# Patient Record
Sex: Female | Born: 1955 | Hispanic: No | Marital: Married | Smoking: Former smoker
Health system: Southern US, Community
[De-identification: ages and names within clinical notes are randomized; demographics above are authoritative.]

## PROBLEM LIST (undated history)

## (undated) DIAGNOSIS — L659 Nonscarring hair loss, unspecified: Secondary | ICD-10-CM

## (undated) DIAGNOSIS — R0609 Other forms of dyspnea: Secondary | ICD-10-CM

## (undated) DIAGNOSIS — R0600 Dyspnea, unspecified: Secondary | ICD-10-CM

## (undated) DIAGNOSIS — Z79899 Other long term (current) drug therapy: Secondary | ICD-10-CM

## (undated) DIAGNOSIS — E559 Vitamin D deficiency, unspecified: Secondary | ICD-10-CM

## (undated) DIAGNOSIS — D649 Anemia, unspecified: Secondary | ICD-10-CM

## (undated) DIAGNOSIS — E039 Hypothyroidism, unspecified: Secondary | ICD-10-CM

## (undated) HISTORY — PX: OTHER SURGICAL HISTORY: SHX169

## (undated) HISTORY — DX: Other forms of dyspnea: R06.09

## (undated) HISTORY — PX: COLONOSCOPY W/ BIOPSIES AND POLYPECTOMY: SHX1376

## (undated) HISTORY — DX: Vitamin D deficiency, unspecified: E55.9

## (undated) HISTORY — DX: Other long term (current) drug therapy: Z79.899

## (undated) HISTORY — DX: Nonscarring hair loss, unspecified: L65.9

## (undated) HISTORY — DX: Dyspnea, unspecified: R06.00

---

## 1997-10-02 ENCOUNTER — Other Ambulatory Visit: Admission: RE | Admit: 1997-10-02 | Discharge: 1997-10-02 | Payer: Self-pay | Admitting: Obstetrics and Gynecology

## 1998-07-23 ENCOUNTER — Other Ambulatory Visit: Admission: RE | Admit: 1998-07-23 | Discharge: 1998-07-23 | Payer: Self-pay | Admitting: Obstetrics and Gynecology

## 1998-11-12 ENCOUNTER — Other Ambulatory Visit: Admission: RE | Admit: 1998-11-12 | Discharge: 1998-11-12 | Payer: Self-pay | Admitting: Obstetrics and Gynecology

## 2000-01-13 ENCOUNTER — Other Ambulatory Visit: Admission: RE | Admit: 2000-01-13 | Discharge: 2000-01-13 | Payer: Self-pay | Admitting: Obstetrics and Gynecology

## 2001-03-29 ENCOUNTER — Other Ambulatory Visit: Admission: RE | Admit: 2001-03-29 | Discharge: 2001-03-29 | Payer: Self-pay | Admitting: Obstetrics and Gynecology

## 2002-05-16 ENCOUNTER — Other Ambulatory Visit: Admission: RE | Admit: 2002-05-16 | Discharge: 2002-05-16 | Payer: Self-pay | Admitting: Obstetrics and Gynecology

## 2003-07-03 ENCOUNTER — Other Ambulatory Visit: Admission: RE | Admit: 2003-07-03 | Discharge: 2003-07-03 | Payer: Self-pay | Admitting: Obstetrics and Gynecology

## 2004-01-29 ENCOUNTER — Other Ambulatory Visit: Admission: RE | Admit: 2004-01-29 | Discharge: 2004-01-29 | Payer: Self-pay | Admitting: Obstetrics and Gynecology

## 2004-09-30 ENCOUNTER — Other Ambulatory Visit: Admission: RE | Admit: 2004-09-30 | Discharge: 2004-09-30 | Payer: Self-pay | Admitting: Obstetrics and Gynecology

## 2008-10-02 ENCOUNTER — Encounter (INDEPENDENT_AMBULATORY_CARE_PROVIDER_SITE_OTHER): Payer: Self-pay | Admitting: Obstetrics and Gynecology

## 2008-10-02 ENCOUNTER — Ambulatory Visit (HOSPITAL_COMMUNITY): Admission: RE | Admit: 2008-10-02 | Discharge: 2008-10-02 | Payer: Self-pay | Admitting: Obstetrics and Gynecology

## 2008-10-04 ENCOUNTER — Encounter (INDEPENDENT_AMBULATORY_CARE_PROVIDER_SITE_OTHER): Payer: Self-pay | Admitting: Obstetrics and Gynecology

## 2008-10-04 ENCOUNTER — Ambulatory Visit: Admission: RE | Admit: 2008-10-04 | Discharge: 2008-10-04 | Payer: Self-pay | Admitting: Obstetrics and Gynecology

## 2008-10-04 ENCOUNTER — Ambulatory Visit: Payer: Self-pay | Admitting: Vascular Surgery

## 2009-04-09 ENCOUNTER — Encounter: Admission: RE | Admit: 2009-04-09 | Discharge: 2009-04-09 | Payer: Self-pay | Admitting: Obstetrics and Gynecology

## 2010-04-19 LAB — CBC
HCT: 37.3 % (ref 36.0–46.0)
Hemoglobin: 12.6 g/dL (ref 12.0–15.0)
MCHC: 33.8 g/dL (ref 30.0–36.0)
MCV: 94.7 fL (ref 78.0–100.0)
Platelets: 274 10*3/uL (ref 150–400)
RBC: 3.93 MIL/uL (ref 3.87–5.11)
RDW: 12.9 % (ref 11.5–15.5)
WBC: 6.2 10*3/uL (ref 4.0–10.5)

## 2011-10-06 ENCOUNTER — Other Ambulatory Visit: Payer: Self-pay | Admitting: Gastroenterology

## 2012-04-28 ENCOUNTER — Encounter (HOSPITAL_COMMUNITY): Payer: Self-pay | Admitting: *Deleted

## 2012-04-28 ENCOUNTER — Other Ambulatory Visit: Payer: Self-pay | Admitting: Neurosurgery

## 2012-04-29 ENCOUNTER — Ambulatory Visit (HOSPITAL_COMMUNITY): Payer: BC Managed Care – PPO | Admitting: Certified Registered Nurse Anesthetist

## 2012-04-29 ENCOUNTER — Encounter (HOSPITAL_COMMUNITY): Payer: Self-pay | Admitting: *Deleted

## 2012-04-29 ENCOUNTER — Ambulatory Visit (HOSPITAL_COMMUNITY): Payer: BC Managed Care – PPO

## 2012-04-29 ENCOUNTER — Encounter (HOSPITAL_COMMUNITY): Payer: Self-pay | Admitting: Certified Registered Nurse Anesthetist

## 2012-04-29 ENCOUNTER — Ambulatory Visit (HOSPITAL_COMMUNITY)
Admission: RE | Admit: 2012-04-29 | Discharge: 2012-04-30 | Disposition: A | Discharge status: Home / Self Care | Payer: BC Managed Care – PPO | Source: Ambulatory Visit | Attending: Neurosurgery | Admitting: Neurosurgery

## 2012-04-29 ENCOUNTER — Encounter (HOSPITAL_COMMUNITY)
Admission: RE | Disposition: A | Discharge status: Home / Self Care | Payer: Self-pay | Source: Ambulatory Visit | Attending: Neurosurgery

## 2012-04-29 DIAGNOSIS — M4310 Spondylolisthesis, site unspecified: Secondary | ICD-10-CM | POA: Insufficient documentation

## 2012-04-29 DIAGNOSIS — M5000 Cervical disc disorder with myelopathy, unspecified cervical region: Secondary | ICD-10-CM | POA: Insufficient documentation

## 2012-04-29 DIAGNOSIS — M4712 Other spondylosis with myelopathy, cervical region: Secondary | ICD-10-CM | POA: Insufficient documentation

## 2012-04-29 HISTORY — DX: Anemia, unspecified: D64.9

## 2012-04-29 HISTORY — PX: ANTERIOR CERVICAL DECOMP/DISCECTOMY FUSION: SHX1161

## 2012-04-29 HISTORY — DX: Hypothyroidism, unspecified: E03.9

## 2012-04-29 LAB — BASIC METABOLIC PANEL
BUN: 16 mg/dL (ref 6–23)
CO2: 27 mEq/L (ref 19–32)
Calcium: 9.5 mg/dL (ref 8.4–10.5)
Chloride: 106 mEq/L (ref 96–112)
Creatinine, Ser: 0.61 mg/dL (ref 0.50–1.10)
GFR calc Af Amer: >90 mL/min (ref >90)
GFR calc non Af Amer: >90 mL/min (ref >90)
Glucose, Bld: 91 mg/dL (ref 70–99)
Potassium: 3.8 mEq/L (ref 3.5–5.1)
Sodium: 140 mEq/L (ref 135–145)

## 2012-04-29 LAB — CBC
HCT: 38 % (ref 36.0–46.0)
Hemoglobin: 13.1 g/dL (ref 12.0–15.0)
MCH: 31.3 pg (ref 26.0–34.0)
MCHC: 34.5 g/dL (ref 30.0–36.0)
MCV: 90.9 fL (ref 78.0–100.0)
Platelets: 295 10*3/uL (ref 150–400)
RBC: 4.18 MIL/uL (ref 3.87–5.11)
RDW: 12.5 % (ref 11.5–15.5)
WBC: 5.6 10*3/uL (ref 4.0–10.5)

## 2012-04-29 LAB — SURGICAL PCR SCREEN
MRSA, PCR: NEGATIVE
Staphylococcus aureus: NEGATIVE

## 2012-04-29 SURGERY — ANTERIOR CERVICAL DECOMPRESSION/DISCECTOMY FUSION 1 LEVEL
Anesthesia: General | Site: Spine Cervical | Wound class: Clean

## 2012-04-29 MED ORDER — ONDANSETRON HCL 4 MG/2ML IJ SOLN
INTRAMUSCULAR | Status: DC | PRN
  Administered 2012-04-29: 4 mg via INTRAVENOUS

## 2012-04-29 MED ORDER — DOCUSATE SODIUM 100 MG PO CAPS: 100.0000 mg | ORAL_CAPSULE | Freq: Two times a day (BID) | ORAL | Status: DC

## 2012-04-29 MED ORDER — KCL IN DEXTROSE-NACL 20-5-0.45 MEQ/L-%-% IV SOLN
INTRAVENOUS | Status: DC
  Filled 2012-04-29 (×3): qty 1000

## 2012-04-29 MED ORDER — OXYCODONE-ACETAMINOPHEN 5-325 MG PO TABS
1.0000 | ORAL_TABLET | ORAL | Status: DC | PRN
  Administered 2012-04-29: 2 via ORAL
  Filled 2012-04-29: qty 2

## 2012-04-29 MED ORDER — BUPIVACAINE HCL (PF) 0.5 % IJ SOLN
INTRAMUSCULAR | Status: DC | PRN
  Administered 2012-04-29: 2.5 mL

## 2012-04-29 MED ORDER — HEMOSTATIC AGENTS (NO CHARGE) OPTIME
TOPICAL | Status: DC | PRN
  Administered 2012-04-29: 1 via TOPICAL

## 2012-04-29 MED ORDER — PROPOFOL 10 MG/ML IV BOLUS
INTRAVENOUS | Status: DC | PRN
  Administered 2012-04-29: 170 mg via INTRAVENOUS

## 2012-04-29 MED ORDER — ZOLPIDEM TARTRATE 5 MG PO TABS: 5.0000 mg | ORAL_TABLET | Freq: Every evening | ORAL | Status: DC | PRN

## 2012-04-29 MED ORDER — FLEET ENEMA 7-19 GM/118ML RE ENEM
1.0000 | ENEMA | Freq: Once | RECTAL | Status: AC | PRN
  Filled 2012-04-29: qty 1

## 2012-04-29 MED ORDER — VITAMIN D (ERGOCALCIFEROL) 1.25 MG (50000 UNIT) PO CAPS: 50000.0000 [IU] | ORAL_CAPSULE | ORAL | Status: DC

## 2012-04-29 MED ORDER — 0.9 % SODIUM CHLORIDE (POUR BTL) OPTIME
TOPICAL | Status: DC | PRN
  Administered 2012-04-29: 1000 mL

## 2012-04-29 MED ORDER — MORPHINE SULFATE 2 MG/ML IJ SOLN
INTRAMUSCULAR | Status: AC
  Filled 2012-04-29: qty 1

## 2012-04-29 MED ORDER — SODIUM CHLORIDE 0.9 % IJ SOLN
3.0000 mL | Freq: Two times a day (BID) | INTRAMUSCULAR | Status: DC
  Administered 2012-04-29: 10 mL via INTRAVENOUS

## 2012-04-29 MED ORDER — ARTIFICIAL TEARS OP OINT
TOPICAL_OINTMENT | OPHTHALMIC | Status: DC | PRN
  Administered 2012-04-29: 1 via OPHTHALMIC

## 2012-04-29 MED ORDER — ALBUMIN HUMAN 5 % IV SOLN
INTRAVENOUS | Status: DC | PRN
  Administered 2012-04-29: 14:00:00 via INTRAVENOUS

## 2012-04-29 MED ORDER — LEVOTHYROXINE SODIUM 112 MCG PO TABS
112.0000 ug | ORAL_TABLET | Freq: Every day | ORAL | Status: DC
  Filled 2012-04-29 (×2): qty 1

## 2012-04-29 MED ORDER — SODIUM CHLORIDE 0.9 % IV SOLN
INTRAVENOUS | Status: AC
  Filled 2012-04-29: qty 500

## 2012-04-29 MED ORDER — ONDANSETRON HCL 4 MG/2ML IJ SOLN
4.0000 mg | INTRAMUSCULAR | Status: DC | PRN
  Administered 2012-04-29: 4 mg via INTRAVENOUS
  Filled 2012-04-29: qty 2

## 2012-04-29 MED ORDER — LIDOCAINE-EPINEPHRINE 1 %-1:100000 IJ SOLN
INTRAMUSCULAR | Status: DC | PRN
  Administered 2012-04-29: 2.5 mL

## 2012-04-29 MED ORDER — HYDROMORPHONE HCL PF 1 MG/ML IJ SOLN
0.2500 mg | INTRAMUSCULAR | Status: DC | PRN
  Administered 2012-04-29 (×2): 0.5 mg via INTRAVENOUS

## 2012-04-29 MED ORDER — SODIUM CHLORIDE 0.9 % IV SOLN: 250.0000 mL | INTRAVENOUS | Status: DC

## 2012-04-29 MED ORDER — MIDAZOLAM HCL 5 MG/5ML IJ SOLN
INTRAMUSCULAR | Status: DC | PRN
  Administered 2012-04-29 (×2): 1 mg via INTRAVENOUS

## 2012-04-29 MED ORDER — CEFAZOLIN SODIUM-DEXTROSE 2-3 GM-% IV SOLR
2.0000 g | INTRAVENOUS | Status: AC
  Administered 2012-04-29: 2 g via INTRAVENOUS
  Filled 2012-04-29: qty 50

## 2012-04-29 MED ORDER — BACITRACIN 50000 UNITS IM SOLR
INTRAMUSCULAR | Status: AC
  Filled 2012-04-29: qty 1

## 2012-04-29 MED ORDER — HYDROMORPHONE HCL PF 1 MG/ML IJ SOLN
INTRAMUSCULAR | Status: AC
  Filled 2012-04-29: qty 1

## 2012-04-29 MED ORDER — BISACODYL 10 MG RE SUPP: 10.0000 mg | Freq: Every day | RECTAL | Status: DC | PRN

## 2012-04-29 MED ORDER — PROMETHAZINE HCL 25 MG/ML IJ SOLN: 6.2500 mg | INTRAMUSCULAR | Status: DC | PRN

## 2012-04-29 MED ORDER — MENTHOL 3 MG MT LOZG: 1.0000 | LOZENGE | OROMUCOSAL | Status: DC | PRN

## 2012-04-29 MED ORDER — LIDOCAINE HCL (CARDIAC) 20 MG/ML IV SOLN
INTRAVENOUS | Status: DC | PRN
  Administered 2012-04-29: 100 mg via INTRAVENOUS

## 2012-04-29 MED ORDER — ACETAMINOPHEN 650 MG RE SUPP: 650.0000 mg | RECTAL | Status: DC | PRN

## 2012-04-29 MED ORDER — PHENYLEPHRINE HCL 10 MG/ML IJ SOLN
INTRAMUSCULAR | Status: DC | PRN
  Administered 2012-04-29 (×2): 40 ug via INTRAVENOUS
  Administered 2012-04-29: 80 ug via INTRAVENOUS
  Administered 2012-04-29: 40 ug via INTRAVENOUS

## 2012-04-29 MED ORDER — OXYCODONE HCL 5 MG/5ML PO SOLN: 5.0000 mg | Freq: Once | ORAL | Status: DC | PRN

## 2012-04-29 MED ORDER — OXYCODONE HCL 5 MG PO TABS: 5.0000 mg | ORAL_TABLET | Freq: Once | ORAL | Status: DC | PRN

## 2012-04-29 MED ORDER — ALUM & MAG HYDROXIDE-SIMETH 200-200-20 MG/5ML PO SUSP: 30.0000 mL | Freq: Four times a day (QID) | ORAL | Status: DC | PRN

## 2012-04-29 MED ORDER — DEXAMETHASONE SODIUM PHOSPHATE 10 MG/ML IJ SOLN
INTRAMUSCULAR | Status: DC | PRN
  Administered 2012-04-29: 10 mg via INTRAVENOUS

## 2012-04-29 MED ORDER — SENNOSIDES-DOCUSATE SODIUM 8.6-50 MG PO TABS
1.0000 | ORAL_TABLET | Freq: Every evening | ORAL | Status: DC | PRN
  Filled 2012-04-29: qty 1

## 2012-04-29 MED ORDER — GLYCOPYRROLATE 0.2 MG/ML IJ SOLN
INTRAMUSCULAR | Status: DC | PRN
  Administered 2012-04-29: 0.6 mg via INTRAVENOUS

## 2012-04-29 MED ORDER — MORPHINE SULFATE 2 MG/ML IJ SOLN
1.0000 mg | INTRAMUSCULAR | Status: DC | PRN
  Administered 2012-04-29: 2 mg via INTRAVENOUS

## 2012-04-29 MED ORDER — ROCURONIUM BROMIDE 100 MG/10ML IV SOLN
INTRAVENOUS | Status: DC | PRN
  Administered 2012-04-29: 40 mg via INTRAVENOUS
  Administered 2012-04-29: 10 mg via INTRAVENOUS

## 2012-04-29 MED ORDER — FENTANYL CITRATE 0.05 MG/ML IJ SOLN
INTRAMUSCULAR | Status: DC | PRN
  Administered 2012-04-29: 50 ug via INTRAVENOUS
  Administered 2012-04-29: 100 ug via INTRAVENOUS
  Administered 2012-04-29: 50 ug via INTRAVENOUS

## 2012-04-29 MED ORDER — HYDROCODONE-ACETAMINOPHEN 5-325 MG PO TABS: 1.0000 | ORAL_TABLET | ORAL | Status: DC | PRN

## 2012-04-29 MED ORDER — SODIUM CHLORIDE 0.9 % IJ SOLN: 3.0000 mL | INTRAMUSCULAR | Status: DC | PRN

## 2012-04-29 MED ORDER — THROMBIN 5000 UNITS EX SOLR
CUTANEOUS | Status: DC | PRN
  Administered 2012-04-29 (×2): 5000 [IU] via TOPICAL

## 2012-04-29 MED ORDER — ACETAMINOPHEN 325 MG PO TABS
650.0000 mg | ORAL_TABLET | ORAL | Status: DC | PRN
  Filled 2012-04-29: qty 2

## 2012-04-29 MED ORDER — LACTATED RINGERS IV SOLN
INTRAVENOUS | Status: DC | PRN
  Administered 2012-04-29 (×2): via INTRAVENOUS

## 2012-04-29 MED ORDER — MUPIROCIN 2 % EX OINT
TOPICAL_OINTMENT | CUTANEOUS | Status: AC
  Administered 2012-04-29: 09:00:00
  Filled 2012-04-29: qty 22

## 2012-04-29 MED ORDER — DIAZEPAM 5 MG PO TABS: 5.0000 mg | ORAL_TABLET | Freq: Four times a day (QID) | ORAL | Status: DC | PRN

## 2012-04-29 MED ORDER — CEFAZOLIN SODIUM 1-5 GM-% IV SOLN
1.0000 g | Freq: Three times a day (TID) | INTRAVENOUS | Status: AC
  Administered 2012-04-29 – 2012-04-30 (×2): 1 g via INTRAVENOUS
  Filled 2012-04-29 (×2): qty 50

## 2012-04-29 MED ORDER — PHENOL 1.4 % MT LIQD: 1.0000 | OROMUCOSAL | Status: DC | PRN

## 2012-04-29 MED ORDER — NEOSTIGMINE METHYLSULFATE 1 MG/ML IJ SOLN
INTRAMUSCULAR | Status: DC | PRN
  Administered 2012-04-29: 4 mg via INTRAVENOUS

## 2012-04-29 MED ORDER — PANTOPRAZOLE SODIUM 40 MG IV SOLR
40.0000 mg | Freq: Every day | INTRAVENOUS | Status: DC
  Administered 2012-04-29: 40 mg via INTRAVENOUS
  Filled 2012-04-29: qty 40

## 2012-04-29 SURGICAL SUPPLY — 74 items
ADH SKN CLS APL DERMABOND .7 (GAUZE/BANDAGES/DRESSINGS)
ADH SKN CLS LQ APL DERMABOND (GAUZE/BANDAGES/DRESSINGS) ×1
APL SKNCLS STERI-STRIP NONHPOA (GAUZE/BANDAGES/DRESSINGS)
BAG DECANTER FOR FLEXI CONT (MISCELLANEOUS) ×2 IMPLANT
BANDAGE GAUZE ELAST BULKY 4 IN (GAUZE/BANDAGES/DRESSINGS) IMPLANT
BENZOIN TINCTURE PRP APPL 2/3 (GAUZE/BANDAGES/DRESSINGS) IMPLANT
BIT DRILL 14MM (INSTRUMENTS) IMPLANT
BIT DRILL NEURO 2X3.1 SFT TUCH (MISCELLANEOUS) ×1 IMPLANT
BLADE ULTRA TIP 2M (BLADE) ×1 IMPLANT
BUR BARREL STRAIGHT FLUTE 4.0 (BURR) ×2 IMPLANT
CANISTER SUCTION 2500CC (MISCELLANEOUS) ×2 IMPLANT
CLOTH BEACON ORANGE TIMEOUT ST (SAFETY) ×2 IMPLANT
CONT SPEC 4OZ CLIKSEAL STRL BL (MISCELLANEOUS) ×2 IMPLANT
COVER MAYO STAND STRL (DRAPES) ×2 IMPLANT
DERMABOND ADHESIVE PROPEN (GAUZE/BANDAGES/DRESSINGS) ×1
DERMABOND ADVANCED (GAUZE/BANDAGES/DRESSINGS)
DERMABOND ADVANCED .7 DNX12 (GAUZE/BANDAGES/DRESSINGS) ×1 IMPLANT
DERMABOND ADVANCED .7 DNX6 (GAUZE/BANDAGES/DRESSINGS) IMPLANT
DRAPE LAPAROTOMY 100X72 PEDS (DRAPES) ×2 IMPLANT
DRAPE MICROSCOPE LEICA (MISCELLANEOUS) ×2 IMPLANT
DRAPE POUCH INSTRU U-SHP 10X18 (DRAPES) ×2 IMPLANT
DRAPE PROXIMA HALF (DRAPES) ×1 IMPLANT
DRESSING TELFA 8X3 (GAUZE/BANDAGES/DRESSINGS) IMPLANT
DRILL 14MM (INSTRUMENTS) ×2
DRILL NEURO 2X3.1 SOFT TOUCH (MISCELLANEOUS) ×2
DURAPREP 6ML APPLICATOR 50/CS (WOUND CARE) ×2 IMPLANT
ELECT COATED BLADE 2.86 ST (ELECTRODE) ×2 IMPLANT
ELECT REM PT RETURN 9FT ADLT (ELECTROSURGICAL) ×2
ELECTRODE REM PT RTRN 9FT ADLT (ELECTROSURGICAL) ×1 IMPLANT
GAUZE SPONGE 4X4 16PLY XRAY LF (GAUZE/BANDAGES/DRESSINGS) IMPLANT
GLOVE BIO SURGEON STRL SZ8 (GLOVE) ×3 IMPLANT
GLOVE BIOGEL PI IND STRL 8 (GLOVE) ×1 IMPLANT
GLOVE BIOGEL PI IND STRL 8.5 (GLOVE) ×1 IMPLANT
GLOVE BIOGEL PI INDICATOR 8 (GLOVE) ×2
GLOVE BIOGEL PI INDICATOR 8.5 (GLOVE) ×2
GLOVE ECLIPSE 8.0 STRL XLNG CF (GLOVE) ×2 IMPLANT
GLOVE EXAM NITRILE LRG STRL (GLOVE) ×1 IMPLANT
GLOVE EXAM NITRILE MD LF STRL (GLOVE) IMPLANT
GLOVE EXAM NITRILE XL STR (GLOVE) IMPLANT
GLOVE EXAM NITRILE XS STR PU (GLOVE) IMPLANT
GLOVE INDICATOR 8.5 STRL (GLOVE) ×1 IMPLANT
GLOVE SURG SS PI 8.0 STRL IVOR (GLOVE) ×3 IMPLANT
GOWN BRE IMP SLV AUR LG STRL (GOWN DISPOSABLE) IMPLANT
GOWN BRE IMP SLV AUR XL STRL (GOWN DISPOSABLE) ×2 IMPLANT
GOWN STRL REIN 2XL LVL4 (GOWN DISPOSABLE) ×3 IMPLANT
HEAD HALTER (SOFTGOODS) ×2 IMPLANT
KIT BASIN OR (CUSTOM PROCEDURE TRAY) ×2 IMPLANT
KIT ROOM TURNOVER OR (KITS) ×2 IMPLANT
NDL HYPO 18GX1.5 BLUNT FILL (NEEDLE) IMPLANT
NDL HYPO 25X1 1.5 SAFETY (NEEDLE) ×1 IMPLANT
NDL SPNL 22GX3.5 QUINCKE BK (NEEDLE) ×1 IMPLANT
NEEDLE HYPO 18GX1.5 BLUNT FILL (NEEDLE) IMPLANT
NEEDLE HYPO 25X1 1.5 SAFETY (NEEDLE) ×2 IMPLANT
NEEDLE SPNL 22GX3.5 QUINCKE BK (NEEDLE) ×2 IMPLANT
NS IRRIG 1000ML POUR BTL (IV SOLUTION) ×2 IMPLANT
PACK LAMINECTOMY NEURO (CUSTOM PROCEDURE TRAY) ×2 IMPLANT
PAD ARMBOARD 7.5X6 YLW CONV (MISCELLANEOUS) ×6 IMPLANT
PIN DISTRACTION 14MM (PIN) ×4 IMPLANT
PLATE 14MM (Plate) ×1 IMPLANT
RUBBERBAND STERILE (MISCELLANEOUS) ×4 IMPLANT
SCREW 14MM (Screw) ×4 IMPLANT
SPACER ASSEM CERV LORD 7M (Spacer) ×1 IMPLANT
SPONGE GAUZE 4X4 12PLY (GAUZE/BANDAGES/DRESSINGS) IMPLANT
SPONGE INTESTINAL PEANUT (DISPOSABLE) ×2 IMPLANT
SPONGE SURGIFOAM ABS GEL SZ50 (HEMOSTASIS) ×2 IMPLANT
STAPLER SKIN PROX WIDE 3.9 (STAPLE) IMPLANT
STRIP CLOSURE SKIN 1/2X4 (GAUZE/BANDAGES/DRESSINGS) IMPLANT
SUT VIC AB 3-0 SH 8-18 (SUTURE) ×4 IMPLANT
SYR 20ML ECCENTRIC (SYRINGE) ×2 IMPLANT
SYR 3ML LL SCALE MARK (SYRINGE) IMPLANT
TOWEL OR 17X24 6PK STRL BLUE (TOWEL DISPOSABLE) ×2 IMPLANT
TOWEL OR 17X26 10 PK STRL BLUE (TOWEL DISPOSABLE) ×2 IMPLANT
TRAP SPECIMEN MUCOUS 40CC (MISCELLANEOUS) ×2 IMPLANT
WATER STERILE IRR 1000ML POUR (IV SOLUTION) ×2 IMPLANT

## 2012-04-29 NOTE — Anesthesia Postprocedure Evaluation (Signed)
Anesthesia Post Note  Patient: Sarah [name]  Procedure(s) Performed: Procedure(s) (LRB): ANTERIOR CERVICAL DECOMPRESSION/DISCECTOMY FUSION 1 LEVEL (N/A)  Anesthesia type: general  Patient location: PACU  Post pain: Pain level controlled  Post assessment: Patient's Cardiovascular Status Stable  Last Vitals:  Filed Vitals:   04/29/12 1430  BP: 112/69  Pulse: 67  Temp:   Resp: 10    Post vital signs: Reviewed and stable  Level of consciousness: sedated  Complications: No apparent anesthesia complications

## 2012-04-29 NOTE — Transfer of Care (Signed)
Immediate Anesthesia Transfer of Care Note  Patient: Sarah [name]  Procedure(s) Performed: Procedure(s) with comments: ANTERIOR CERVICAL DECOMPRESSION/DISCECTOMY FUSION 1 LEVEL (N/A) - Cervical three-four anterior cervical decompression with fusion plating and bonegraft  Patient Location: PACU  Anesthesia Type:General  Level of Consciousness: awake, alert  and oriented  Airway & Oxygen Therapy: Patient Spontanous Breathing and Patient connected to nasal cannula oxygen  Post-op Assessment: Report given to PACU RN, Post -op Vital signs reviewed and stable and Patient moving all extremities X 4  Post vital signs: Reviewed and stable  Complications: No apparent anesthesia complications

## 2012-04-29 NOTE — Interval H&P Note (Signed)
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 After consideration of risks, benefits and other options for treatment, the patient has consented to  Procedure(s) with comments: ANTERIOR CERVICAL DECOMPRESSION/DISCECTOMY FUSION 1 LEVEL (N/A) - C34 anterior cervical decompression with fusion plating and bonegraft as a surgical intervention .  The patient's history has been reviewed, patient examined, no change in status, stable for surgery.  I have reviewed the patient's chart and labs.  Questions were answered to the patient's satisfaction.     [name] D

## 2012-04-29 NOTE — Brief Op Note (Signed)
04/29/2012  1:54 PM  PATIENT:  Sarah [name]  57 y.o. female  PRE-OPERATIVE DIAGNOSIS:  cervical herniated disc with myelopathy, cervical spondylosis with myelopathy, cervical stenosis, cervical subluxation C 34  POST-OPERATIVE DIAGNOSIS:  cervical herniated disc with myelopathy, cervical spondylosis with myelopathy, cervical stenosis, cervical subluxation C 34  PROCEDURE:  Procedure(s) with comments: ANTERIOR CERVICAL DECOMPRESSION/DISCECTOMY FUSION 1 LEVEL (N/A) - Cervical three-four anterior cervical decompression with fusion plating and bonegraft allograft/autograft  SURGEON:  Surgeon(s) and Role:    * Maeola Harman, MD - Primary    * Mariam Dollar, MD - Assisting  PHYSICIAN ASSISTANT:   ASSISTANTS: Poteat, RN   ANESTHESIA:   general  EBL:  Total I/O In: 1250 [I.V.:1000; IV Piggyback:250] Out: -   BLOOD ADMINISTERED:none  DRAINS: none   LOCAL MEDICATIONS USED:  MARCAINE     SPECIMEN:  No Specimen  DISPOSITION OF SPECIMEN:  N/A  COUNTS:  YES  TOURNIQUET:  * No tourniquets in log *  DICTATION: Patient is 57 year old female with subluxation of C3 on 4 with stenosis, myelopathy, cord compression, HNP.  It was elected to take her to surgery for anterior cervical decompression and fusion C 34 level.  PROCEDURE: Patient was brought to operating room and following the smooth and uncomplicated induction of general endotracheal anesthesia her head was placed on a horseshoe head holder she was placed in 5 pounds of Holter traction and his anterior neck was prepped and draped in usual sterile fashion. An incision was made on the left [name] of midline after infiltrating the skin and subcutaneous tissues with local lidocaine. The platysmal layer was incised and subplatysmal dissection was performed exposing the anterior border sternocleidomastoid muscle. Using blunt dissection the carotid sheath was kept lateral and trachea and esophagus kept medial exposing the anterior cervical spine.  A bent spinal needle was placed it was felt to be the C 34 level and this was confirmed on intraoperative x-ray. Longus coli muscles were taken down from the anterior cervical spine using electrocautery and key elevator and self-retaining retractor was placed exposing the C 34 level. The interspace was incised and a thorough discectomy was performed. Distraction pins were placed. Uncinate spurs and central spondylitic ridges were drilled down with a high-speed drill. The spinal cord dura and both C4 nerve roots were widely decompressed as was the spinal cord dura. Hemostasis was assured. After trial sizing a 7 mm lordotic allograft bone wedge was selected and packed with local autograft. This was tamped into position and countersunk appropriately. Distraction weight was removed. A 14 mm trestle luxe anterior cervical plate was affixed to the cervical spine with 14 mm variable-angle screws 2 at C3, 2 at C4. All screws were well-positioned and locking mechanisms were engaged. A final X ray was obtained which showed well positioned graft and anterior plate without complicating features. Soft tissues were inspected and found to be in good repair. The wound was irrigated. The platysma layer was closed with 3-0 Vicryl stitches and the skin was reapproximated with 3-0 Vicryl subcuticular stitches. The wound was dressed with Dermabond. Counts were correct at the end of the case. Patient was extubated and taken to recovery in stable and satisfactory condition.   PLAN OF CARE: Admit for overnight observation  PATIENT DISPOSITION:  PACU - hemodynamically stable.   Delay start of Pharmacological VTE agent (>24hrs) due to surgical blood loss or risk of bleeding: yes

## 2012-04-29 NOTE — Preoperative (Signed)
Beta Blockers   Reason not to administer Beta Blockers:Not Applicable 

## 2012-04-29 NOTE — H&P (Signed)
Sarah [name] #[id number] DOB:  Jan 10, 1956   04/05/2012:  Sarah [name] returns today to review her MRI of her cervical spine and plain radiographs.  The cervical x-rays on flexion and extension views shows that she has retrolisthesis of C3 on C4, which resolves on flexion. The anterolisthesis of C4 on C5 does not appear to change significantly between flexion and extension views and is 2.9 mm. on extension, increasing to 3.1 mm. on flexion.   Her lumbar radiographs show that she has dextroconvex scoliosis of the lumbar spine with apex of the curvature at L3.    Her cervical MRI shows she has multilevel spondylosis, but she has severe cord compression at C3-4 with cord signal just below the disc space at this level. While she does have some loss of normal cervical lordosis with degenerative changes at C4-5, C5-6, and C6-7 levels, there does not appear to be severe cord compression at any level and nor does there appear to be significant nerve root compression at any other level.   She continues to notice that when she tips her head back or is doing hair that her hands go numb and her arms ache.  I have advised her not to do that anymore. I have also strongly encouraged her to go ahead with surgery as I do think that there is instability at the C3-4 level with cord compression and cord signal changes, and that putting this off could be potentially damaging to her.  I have recommended that she undergo anterior cervical decompression and fusion at the C3-4 level. She wishes to do this as an outpatient and we plan on doing the surgery at the [facility]. She wants to do it the week of the 10th of May and we are going to look into planning that. Sarah [name] will meet with her to discuss the nature of the surgery, go over models and answer further questions.  She also will be fitted for a soft cervical collar.           Sarah [name]. Sarah [name], M.D./aft  cc: Dr. Mila Palmer     NEUROSURGICAL CONSULTATION  Sarah [name]  #[id number] DOB:  1955-09-26  February 23, 2012  HISTORY:     Sarah [name] is a 57 year old woman who works as a Scientist, research (medical) and self-employed.  She presents with the chief complaint of neck, arm and back pain.  She describes pain into her left deltoid.  She notes numbness in the left upper arm, both of her hands with some activities.  She also notes weakness in her left arm.  She says this began in November 2013.  She says she rarely gets pain down both of her legs.  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  She does yoga and exercises with help her with that.  Her primary physician has restricted weight lifting until neurosurgical evaluation.    She notes pain in her neck and across her upper back.  She says as of November 2013 she has noted tingling to the tips of her fingers and a current across her arms going into her legs.    REVIEW OF SYSTEMS:   A detailed Review of Systems sheet was reviewed with the patient.  It is otherwise unremarkable.    PAST MEDICAL HISTORY:      Current Medical Conditions:    As previously described.  She also has a history of hypothyroidism and gastroesophageal reflux disease.  Prior Operations and Hospitalizations:   She has had prior surgery for a trigger finger in 2011.     Medications and Allergies:  Synthroid 0.112 q.d.      Height and Weight:     5'6" tall and 143 pounds.  BMI is 23.5.      FAMILY HISTORY:    Mother is 41 in fair health.  Father is deceased.     SOCIAL HISTORY:    She denies tobacco or drug use.  She is a social drinker of alcoholic beverages.  She said she quit smoking in 1998.      DIAGNOSTIC STUDIES:   I reviewed outside lab testing which is unremarkable.  She has had cervical radiographs which show multilevel cervical disc degeneration with anterolisthesis, right foraminal stenosis at C5-6.  There is moderate cervical spondylosis with degenerative  spondylolisthesis of C4 on C5 of 2 mm.  Also she has evidence of S-shaped thoracic scoliosis with mild thoracic spondylosis.    PHYSICAL EXAMINATION:      General Appearance:   On examination today, she is a pleasant and cooperative woman in no acute distress.       Blood Pressure, Pulse:     132/80, heart rate 62 and regular, respirations 18.     HEENT - normocephalic, atraumatic.  The pupils are equal, round and reactive to light.  The extraocular muscles are intact.  Sclerae - white.  Conjunctiva - pink.  Oropharynx benign.  Uvula midline.     Neck - she has negative Spurlings' maneuver and negative Lhermitte's sign with axial compression.  She complains of pain in the left rear deltoid. She also has pain over the left coracoid process.      Respiratory - there is normal respiratory effort with good intercostal function.  Lungs are clear to auscultation.  There are no rales, rhonchi or wheezes.      Cardiovascular - the heart has regular rate and rhythm to auscultation.  No murmurs are appreciated.  There is no extremity edema, cyanosis or clubbing.  There are palpable pedal pulses.      Abdomen - soft, nontender, no hepatosplenomegaly appreciated or masses.  There are active bowel sounds.  No guarding or rebound.      Musculoskeletal Examination - positive shoulder impingement testing on the left. Negative Tinel's and Phalen's both wrists.    NEUROLOGICAL EXAMINATION: The patient is oriented to time, person and place and has good recall of both recent and remote memory with normal attention span and concentration.  The patient speaks with clear and fluent speech and exhibits normal language function and appropriate fund of knowledge.      Cranial Nerve Examination - pupils are equal, round and reactive to light.  Extraocular movements are full.  Visual fields are full to confrontational testing.  Facial sensation and facial movement are symmetric and intact.  Hearing is intact to finger  rub.  Palate is upgoing.  Shoulder shrug is symmetric.  Tongue protrudes in the midline.      Motor Examination - motor strength is 5/5 in the bilateral deltoids, biceps, triceps, handgrips, wrist extensors, interosseous.  In the lower extremities motor strength is 5/5 in hip flexion, extension, quadriceps, hamstrings, plantar flexion, dorsiflexion and extensor hallucis longus.      Sensory Examination - she has some splitting in the left fourth digit with a median distribution of numbness.      Deep Tendon Reflexes - 3+ in the biceps, triceps, and brachioradialis with positive  Hoffmann's sign on the right and more significantly positive Hoffmann's sign on the left with positive suprapatellar and crossed adductor reflexes.      Cerebellar Examination - normal coordination in upper and lower extremities and normal rapid alternating movements.  Romberg test is negative.    IMPRESSION AND RECOMMENDATIONS: Sarah [name] is a 57 year old woman with neck pain, left deltoid pain, and hyperreflexia with positive Hoffmann's signs with cervical subluxation and cervical stenosis.  She also complains of low back pain.  I have recommended that we get plain radiographs of her lumbar spine to assess the severity of her scoliosis and also we get flexion and extension cervical radiographs to make sure that she does not have malalignment on flexion or extension given the subluxation of C4 on C5 in the neutral lateral radiographs. I would like to get an MRI of her cervical spine which I think is important given her hyperreflexia and concern for possible cervical myelopathy.  I will see her back after those have been obtained and make further recommendations at that point.     [facility]    Sarah [name]. Sarah [name], M.D.  JDS:gde  cc: Dr. Mila Palmer

## 2012-04-29 NOTE — Anesthesia Preprocedure Evaluation (Addendum)
Anesthesia Evaluation  Patient identified by MRN, date of birth, ID band Patient awake    Reviewed: Allergy & Precautions, H&P , NPO status , Patient's Chart, lab work & pertinent test results  History of Anesthesia Complications Negative for: history of anesthetic complications  Airway Mallampati: I TM Distance: >3 FB Neck ROM: Full    Dental  (+) Teeth Intact and Dental Advisory Given   Pulmonary former smoker,    Pulmonary exam normal       Cardiovascular negative cardio ROS      Neuro/Psych negative psych ROS   GI/Hepatic negative GI ROS,   Endo/Other  Hypothyroidism   Renal/GU negative Renal ROS     Musculoskeletal   Abdominal   Peds  Hematology   Anesthesia Other Findings   Reproductive/Obstetrics                         Anesthesia Physical Anesthesia Plan  ASA: II  Anesthesia Plan: General   Post-op Pain Management:    Induction: Intravenous  Airway Management Planned: Oral ETT  Additional Equipment:   Intra-op Plan:   Post-operative Plan: Extubation in OR  Informed Consent: I have reviewed the patients History and Physical, chart, labs and discussed the procedure including the risks, benefits and alternatives for the proposed anesthesia with the patient or authorized representative who has indicated his/her understanding and acceptance.   Dental advisory given  Plan Discussed with: CRNA, Anesthesiologist and Surgeon  Anesthesia Plan Comments:        Anesthesia Quick Evaluation

## 2012-04-29 NOTE — Progress Notes (Signed)
Awake, alert, conversant.  MAEW with good strength.  Doing well. 

## 2012-04-29 NOTE — Op Note (Signed)
04/29/2012  1:54 PM  PATIENT:  Sarah [name]  57 y.o. female  PRE-OPERATIVE DIAGNOSIS:  cervical herniated disc with myelopathy, cervical spondylosis with myelopathy, cervical stenosis, cervical subluxation C 34  POST-OPERATIVE DIAGNOSIS:  cervical herniated disc with myelopathy, cervical spondylosis with myelopathy, cervical stenosis, cervical subluxation C 34  PROCEDURE:  Procedure(s) with comments: ANTERIOR CERVICAL DECOMPRESSION/DISCECTOMY FUSION 1 LEVEL (N/A) - Cervical three-four anterior cervical decompression with fusion plating and bonegraft allograft/autograft  SURGEON:  Surgeon(s) and Role:    * Kanija Remmel, MD - Primary    * Gary P Cram, MD - Assisting  PHYSICIAN ASSISTANT:   ASSISTANTS: Poteat, RN   ANESTHESIA:   general  EBL:  Total I/O In: 1250 [I.V.:1000; IV Piggyback:250] Out: -   BLOOD ADMINISTERED:none  DRAINS: none   LOCAL MEDICATIONS USED:  MARCAINE     SPECIMEN:  No Specimen  DISPOSITION OF SPECIMEN:  N/A  COUNTS:  YES  TOURNIQUET:  * No tourniquets in log *  DICTATION: Patient is 57 year old female with subluxation of C3 on 4 with stenosis, myelopathy, cord compression, HNP.  It was elected to take her to surgery for anterior cervical decompression and fusion C 34 level.  PROCEDURE: Patient was brought to operating room and following the smooth and uncomplicated induction of general endotracheal anesthesia her head was placed on a horseshoe head holder she was placed in 5 pounds of Holter traction and his anterior neck was prepped and draped in usual sterile fashion. An incision was made on the left side of midline after infiltrating the skin and subcutaneous tissues with local lidocaine. The platysmal layer was incised and subplatysmal dissection was performed exposing the anterior border sternocleidomastoid muscle. Using blunt dissection the carotid sheath was kept lateral and trachea and esophagus kept medial exposing the anterior cervical spine.  A bent spinal needle was placed it was felt to be the C 34 level and this was confirmed on intraoperative x-ray. Longus coli muscles were taken down from the anterior cervical spine using electrocautery and key elevator and self-retaining retractor was placed exposing the C 34 level. The interspace was incised and a thorough discectomy was performed. Distraction pins were placed. Uncinate spurs and central spondylitic ridges were drilled down with a high-speed drill. The spinal cord dura and both C4 nerve roots were widely decompressed as was the spinal cord dura. Hemostasis was assured. After trial sizing a 7 mm lordotic allograft bone wedge was selected and packed with local autograft. This was tamped into position and countersunk appropriately. Distraction weight was removed. A 14 mm trestle luxe anterior cervical plate was affixed to the cervical spine with 14 mm variable-angle screws 2 at C3, 2 at C4. All screws were well-positioned and locking mechanisms were engaged. A final X ray was obtained which showed well positioned graft and anterior plate without complicating features. Soft tissues were inspected and found to be in good repair. The wound was irrigated. The platysma layer was closed with 3-0 Vicryl stitches and the skin was reapproximated with 3-0 Vicryl subcuticular stitches. The wound was dressed with Dermabond. Counts were correct at the end of the case. Patient was extubated and taken to recovery in stable and satisfactory condition.   PLAN OF CARE: Admit for overnight observation  PATIENT DISPOSITION:  PACU - hemodynamically stable.   Delay start of Pharmacological VTE agent (>24hrs) due to surgical blood loss or risk of bleeding: yes  

## 2012-04-30 MED ORDER — PANTOPRAZOLE SODIUM 40 MG PO TBEC: 40.0000 mg | DELAYED_RELEASE_TABLET | Freq: Every day | ORAL | Status: DC

## 2012-04-30 MED ORDER — OXYCODONE-ACETAMINOPHEN 5-325 MG PO TABS: 1.0000 | ORAL_TABLET | ORAL | Status: AC | PRN

## 2012-04-30 NOTE — Discharge Summary (Signed)
Physician Discharge Summary  Patient ID: Sarah [name] MRN: [id number] DOB/AGE: 1955/06/23 57 y.o.  Admit date: 04/29/2012 Discharge date: 04/30/2012  Admission Diagnoses:Cervical subluxation with myelopathy and herniated cervical disc C 34  Discharge Diagnoses: Cervical subluxation with myelopathy and herniated cervical disc C 34 Active Problems:   * No active hospital problems. *   Discharged Condition: good  Hospital Course: Uncomplicated anterior cervical discectomy and fusion C 34  Consults: None  Significant Diagnostic Studies: None  Treatments: surgery: anterior cervical discectomy and fusion C 34  Discharge Exam: Blood pressure 113/77, pulse 73, temperature 98.7 F (37.1 C), temperature source Oral, resp. rate 16, height 5\' 5"  (1.651 m), weight 63.4 kg (139 lb 12.4 oz), SpO2 96.00%. Neurologic: Alert and oriented X 3, normal strength and tone. Normal symmetric reflexes. Normal coordination and gait Wound:CDI  Disposition: Home     Medication List    TAKE these medications       levothyroxine 112 MCG tablet  Commonly known as:  SYNTHROID, LEVOTHROID  Take 112 mcg by mouth daily before breakfast.     oxyCODONE-acetaminophen 5-325 MG per tablet  Commonly known as:  PERCOCET/ROXICET  Take 1-2 tablets by mouth every 4 (four) hours as needed for pain.     Vitamin D (Ergocalciferol) 50000 UNITS Caps  Commonly known as:  DRISDOL  Take 50,000 Units by mouth every Monday.         Signed: Dorian Heckle, MD 04/30/2012, 8:38 AM

## 2012-04-30 NOTE — Progress Notes (Signed)
Pt given D/C instructions with Rx's, verbal understanding given. Pt D/C'd home via wheelchair @ 1000 per MD order. Rema Fendt, RN

## 2012-04-30 NOTE — Progress Notes (Signed)
Subjective: Patient reports doing well  Objective: Vital signs in last 24 hours: Temp:  [97 F (36.1 C)-98.7 F (37.1 C)] 98.7 F (37.1 C) (04/18 0759) Pulse Rate:  [61-90] 73 (04/18 0759) Resp:  [10-41] 16 (04/18 0759) BP: (97-137)/(56-85) 113/77 mmHg (04/18 0759) SpO2:  [93 %-100 %] 96 % (04/18 0759) Weight:  [63.4 kg (139 lb 12.4 oz)] 63.4 kg (139 lb 12.4 oz) (04/17 0902)  Intake/Output from previous day: 04/17 0701 - 04/18 0700 In: 2730 [P.O.:480; I.V.:2000; IV Piggyback:250] Out: -  Intake/Output this shift:    Physical Exam: Full strength, no numbness.  Dressing CDI  Lab Results:  Recent Labs  04/29/12 0905  WBC 5.6  HGB 13.1  HCT 38.0  PLT 295   BMET  Recent Labs  04/29/12 0909  NA 140  K 3.8  CL 106  CO2 27  GLUCOSE 91  BUN 16  CREATININE 0.61  CALCIUM 9.5    Studies/Results: Dg Cervical Spine 2-3 Views  04/29/2012  *RADIOLOGY REPORT*  Clinical Data: Cervical fusion.  Neck pain appear  CERVICAL SPINE - 2-3 VIEW  Comparison: 03/01/2012 MR.  Findings: Film #1 demonstrates a needle from an anterior approach at C3-C4.  Film #2 demonstrates a C3-4 ACDF.  Satisfactory position and alignment.  IMPRESSION:    As above.   Original Report Authenticated By: Davonna Belling, M.D.     Assessment/Plan: Doing well. D/C home    LOS: 1 day    Dorian Heckle, MD 04/30/2012, 8:37 AM

## 2012-04-30 NOTE — Evaluation (Signed)
Occupational Therapy Evaluation Patient Details Name: Sarah [name] MRN: [id number] DOB: Dec 22, 1955 Today's Date: 04/30/2012 Time: 1191-4782 OT Time Calculation (min): 14 min  OT Assessment / Plan / Recommendation Clinical Impression  Pt s/p ACDF.  Education completed and no DME needs.  Pt at mod I level with functional mobility and ADLs and will have 24/7 assist. No further acute OT needs. Signing off.    OT Assessment  Patient does not need any further OT services    Follow Up Recommendations  No OT follow up    Barriers to Discharge      Equipment Recommendations  None recommended by OT    Recommendations for Other Services    Frequency       Precautions / Restrictions Precautions Precautions: Cervical Precaution Comments: Educated pt on cervical precautions. Required Braces or Orthoses: Cervical Brace Cervical Brace: Soft collar Restrictions Weight Bearing Restrictions: No   Pertinent Vitals/Pain See vitals    ADL  Eating/Feeding: Performed;Modified independent Where Assessed - Eating/Feeding: Edge of bed Grooming: Performed;Brushing hair;Modified independent Where Assessed - Grooming: Unsupported sitting Upper Body Bathing: Simulated;Modified independent Where Assessed - Upper Body Bathing: Unsupported sitting Lower Body Bathing: Simulated;Modified independent Where Assessed - Lower Body Bathing: Unsupported sit to stand Upper Body Dressing: Simulated;Modified independent Where Assessed - Upper Body Dressing: Unsupported sitting Lower Body Dressing: Performed;Modified independent Where Assessed - Lower Body Dressing: Unsupported sit to stand Toilet Transfer: Simulated;Modified independent Toilet Transfer Method: Sit to Barista:  (bed) Transfers/Ambulation Related to ADLs: mod I for increased time due to cervical pain ADL Comments: Educated pt on cervical precautions and ADL techniques/strategies (such as crossing ankles over knees for  LB ADLs and keeping items at countertop height).  Pt verbalized good understanding of precautions.  Educated and assisted pt with proper positioning of cervical soft collar.  Pt then able to independently readjust brace for snug supportive fit.      OT Diagnosis:    OT Problem List:   OT Treatment Interventions:     OT Goals    Visit Information  Last OT Received On: 04/30/12 Assistance Needed: +1    Subjective Data      Prior Functioning     Home Living Lives With: Spouse Available Help at Discharge: Family;Available 24 hours/day Type of Home: House Home Access: Level entry Home Layout: Two level;Bed/bath upstairs Alternate Level Stairs-Number of Steps: 14 Alternate Level Stairs-Rails: Left;Right;Can reach both Bathroom Shower/Tub: Engineer, manufacturing systems: Standard Home Adaptive Equipment: None Prior Function Level of Independence: Independent Able to Take Stairs?: Yes Driving: Yes Dominant Hand: Right         Vision/Perception     Cognition  Cognition Arousal/Alertness: Awake/alert Behavior During Therapy: WFL for tasks assessed/performed Overall Cognitive Status: Within Functional Limits for tasks assessed    Extremity/Trunk Assessment Right Upper Extremity Assessment RUE ROM/Strength/Tone: [facility] for tasks assessed Left Upper Extremity Assessment LUE ROM/Strength/Tone: WFL for tasks assessed     Mobility Bed Mobility Bed Mobility: Rolling Right;Right Sidelying to Sit;Sitting - Scoot to Edge of Bed;Sit to Sidelying Right Rolling Right: 5: Supervision;With rail Right Sidelying to Sit: 5: Supervision;With rails Sitting - Scoot to Edge of Bed: 6: Modified independent (Device/Increase time) Sit to Sidelying Right: 5: Supervision Details for Bed Mobility Assistance: VCs for log roll technique.   Transfers Transfers: Stand to Sit;Sit to Stand Sit to Stand: 6: Modified independent (Device/Increase time);From bed Stand to Sit: 6: Modified independent  (Device/Increase time);To bed     Exercise  Balance     End of Session OT - End of Session Equipment Utilized During Treatment: Cervical collar Activity Tolerance: Patient tolerated treatment well Patient left: in bed;with call bell/phone within reach Nurse Communication: Mobility status  GO Functional Assessment Tool Used: clinical judgment Functional Limitation: Self care Self Care Current Status (A2130): 0 percent impaired, limited or restricted Self Care Goal Status (Q6578): 0 percent impaired, limited or restricted Self Care Discharge Status [phone]): 0 percent impaired, limited or restricted  04/30/2012 Cipriano Mile OTR/L Pager [phone] Office [phone]  Cipriano Mile 04/30/2012, 10:19 AM

## 2012-05-04 ENCOUNTER — Encounter (HOSPITAL_COMMUNITY): Payer: Self-pay | Admitting: Neurosurgery

## 2012-10-18 ENCOUNTER — Other Ambulatory Visit: Payer: Self-pay | Admitting: Gastroenterology

## 2012-10-18 DIAGNOSIS — R109 Unspecified abdominal pain: Secondary | ICD-10-CM

## 2012-10-20 ENCOUNTER — Ambulatory Visit
Admission: RE | Admit: 2012-10-20 | Discharge: 2012-10-20 | Disposition: A | Discharge status: Home / Self Care | Payer: BC Managed Care – PPO | Source: Ambulatory Visit | Attending: Gastroenterology | Admitting: Gastroenterology

## 2012-10-20 DIAGNOSIS — R109 Unspecified abdominal pain: Secondary | ICD-10-CM

## 2012-11-01 ENCOUNTER — Other Ambulatory Visit: Payer: Self-pay | Admitting: Obstetrics and Gynecology

## 2012-11-01 DIAGNOSIS — R928 Other abnormal and inconclusive findings on diagnostic imaging of breast: Secondary | ICD-10-CM

## 2012-11-15 ENCOUNTER — Other Ambulatory Visit: Payer: Self-pay | Admitting: Gastroenterology

## 2012-11-15 ENCOUNTER — Ambulatory Visit
Admission: RE | Admit: 2012-11-15 | Discharge: 2012-11-15 | Disposition: A | Discharge status: Home / Self Care | Payer: BC Managed Care – PPO | Source: Ambulatory Visit | Attending: Obstetrics and Gynecology | Admitting: Obstetrics and Gynecology

## 2012-11-15 DIAGNOSIS — R928 Other abnormal and inconclusive findings on diagnostic imaging of breast: Secondary | ICD-10-CM

## 2013-10-26 ENCOUNTER — Other Ambulatory Visit: Payer: Self-pay

## 2013-10-26 DIAGNOSIS — Z1239 Encounter for other screening for malignant neoplasm of breast: Secondary | ICD-10-CM

## 2013-11-22 ENCOUNTER — Other Ambulatory Visit: Payer: Self-pay | Admitting: Obstetrics and Gynecology

## 2013-11-22 DIAGNOSIS — N6310 Unspecified lump in the right breast, unspecified quadrant: Principal | ICD-10-CM

## 2013-11-22 DIAGNOSIS — N6315 Unspecified lump in the right breast, overlapping quadrants: Secondary | ICD-10-CM

## 2013-12-05 ENCOUNTER — Other Ambulatory Visit: Payer: BC Managed Care – PPO

## 2014-01-09 ENCOUNTER — Ambulatory Visit
Admission: RE | Admit: 2014-01-09 | Discharge: 2014-01-09 | Disposition: A | Discharge status: Home / Self Care | Payer: BC Managed Care – PPO | Source: Ambulatory Visit | Attending: Obstetrics and Gynecology | Admitting: Obstetrics and Gynecology

## 2014-01-09 ENCOUNTER — Ambulatory Visit: Payer: BC Managed Care – PPO

## 2014-01-09 DIAGNOSIS — N6310 Unspecified lump in the right breast, unspecified quadrant: Principal | ICD-10-CM

## 2014-01-09 DIAGNOSIS — N6315 Unspecified lump in the right breast, overlapping quadrants: Secondary | ICD-10-CM

## 2015-02-06 ENCOUNTER — Other Ambulatory Visit: Payer: Self-pay

## 2015-02-06 DIAGNOSIS — Z1231 Encounter for screening mammogram for malignant neoplasm of breast: Secondary | ICD-10-CM

## 2015-03-05 ENCOUNTER — Ambulatory Visit
Admission: RE | Admit: 2015-03-05 | Discharge: 2015-03-05 | Disposition: A | Discharge status: Home / Self Care | Payer: BLUE CROSS/BLUE SHIELD | Source: Ambulatory Visit

## 2015-03-05 DIAGNOSIS — Z1231 Encounter for screening mammogram for malignant neoplasm of breast: Secondary | ICD-10-CM

## 2015-03-08 ENCOUNTER — Other Ambulatory Visit: Payer: Self-pay | Admitting: Obstetrics and Gynecology

## 2015-03-08 DIAGNOSIS — R928 Other abnormal and inconclusive findings on diagnostic imaging of breast: Secondary | ICD-10-CM

## 2015-03-19 ENCOUNTER — Ambulatory Visit
Admission: RE | Admit: 2015-03-19 | Discharge: 2015-03-19 | Disposition: A | Discharge status: Home / Self Care | Payer: BLUE CROSS/BLUE SHIELD | Source: Ambulatory Visit | Attending: Obstetrics and Gynecology | Admitting: Obstetrics and Gynecology

## 2015-03-19 DIAGNOSIS — R928 Other abnormal and inconclusive findings on diagnostic imaging of breast: Secondary | ICD-10-CM

## 2015-07-02 DIAGNOSIS — E559 Vitamin D deficiency, unspecified: Secondary | ICD-10-CM | POA: Diagnosis not present

## 2015-11-12 ENCOUNTER — Other Ambulatory Visit: Payer: Self-pay | Admitting: Obstetrics and Gynecology

## 2015-11-12 DIAGNOSIS — Z1231 Encounter for screening mammogram for malignant neoplasm of breast: Secondary | ICD-10-CM

## 2016-01-28 DIAGNOSIS — Z13820 Encounter for screening for osteoporosis: Secondary | ICD-10-CM | POA: Diagnosis not present

## 2016-01-28 DIAGNOSIS — Z01419 Encounter for gynecological examination (general) (routine) without abnormal findings: Secondary | ICD-10-CM | POA: Diagnosis not present

## 2016-01-28 DIAGNOSIS — Z6824 Body mass index (BMI) 24.0-24.9, adult: Secondary | ICD-10-CM | POA: Diagnosis not present

## 2016-02-11 DIAGNOSIS — H524 Presbyopia: Secondary | ICD-10-CM | POA: Diagnosis not present

## 2016-02-22 DIAGNOSIS — R6889 Other general symptoms and signs: Secondary | ICD-10-CM | POA: Diagnosis not present

## 2016-03-17 ENCOUNTER — Ambulatory Visit
Admission: RE | Admit: 2016-03-17 | Discharge: 2016-03-17 | Disposition: A | Discharge status: Home / Self Care | Payer: BLUE CROSS/BLUE SHIELD | Source: Ambulatory Visit | Attending: Obstetrics and Gynecology | Admitting: Obstetrics and Gynecology

## 2016-03-17 ENCOUNTER — Other Ambulatory Visit: Payer: Self-pay | Admitting: Obstetrics and Gynecology

## 2016-03-17 DIAGNOSIS — Z1231 Encounter for screening mammogram for malignant neoplasm of breast: Secondary | ICD-10-CM | POA: Diagnosis not present

## 2016-03-24 DIAGNOSIS — M8580 Other specified disorders of bone density and structure, unspecified site: Secondary | ICD-10-CM | POA: Diagnosis not present

## 2016-03-24 DIAGNOSIS — E039 Hypothyroidism, unspecified: Secondary | ICD-10-CM | POA: Diagnosis not present

## 2016-03-24 DIAGNOSIS — Z79899 Other long term (current) drug therapy: Secondary | ICD-10-CM | POA: Diagnosis not present

## 2016-03-24 DIAGNOSIS — Z13220 Encounter for screening for lipoid disorders: Secondary | ICD-10-CM | POA: Diagnosis not present

## 2016-03-24 DIAGNOSIS — E559 Vitamin D deficiency, unspecified: Secondary | ICD-10-CM | POA: Diagnosis not present

## 2016-05-26 DIAGNOSIS — K210 Gastro-esophageal reflux disease with esophagitis: Secondary | ICD-10-CM | POA: Diagnosis not present

## 2016-05-26 DIAGNOSIS — Z86010 Personal history of colonic polyps: Secondary | ICD-10-CM | POA: Diagnosis not present

## 2016-08-18 ENCOUNTER — Other Ambulatory Visit: Payer: Self-pay | Admitting: Dermatology

## 2016-08-18 DIAGNOSIS — D229 Melanocytic nevi, unspecified: Secondary | ICD-10-CM | POA: Diagnosis not present

## 2016-08-18 DIAGNOSIS — D492 Neoplasm of unspecified behavior of bone, soft tissue, and skin: Secondary | ICD-10-CM | POA: Diagnosis not present

## 2016-08-18 DIAGNOSIS — M71349 Other bursal cyst, unspecified hand: Secondary | ICD-10-CM | POA: Diagnosis not present

## 2016-09-21 DIAGNOSIS — B349 Viral infection, unspecified: Secondary | ICD-10-CM | POA: Diagnosis not present

## 2016-09-21 DIAGNOSIS — J028 Acute pharyngitis due to other specified organisms: Secondary | ICD-10-CM | POA: Diagnosis not present

## 2017-01-26 DIAGNOSIS — K5730 Diverticulosis of large intestine without perforation or abscess without bleeding: Secondary | ICD-10-CM | POA: Diagnosis not present

## 2017-01-26 DIAGNOSIS — Z86010 Personal history of colonic polyps: Secondary | ICD-10-CM | POA: Diagnosis not present

## 2017-01-26 DIAGNOSIS — K219 Gastro-esophageal reflux disease without esophagitis: Secondary | ICD-10-CM | POA: Diagnosis not present

## 2017-01-26 DIAGNOSIS — K449 Diaphragmatic hernia without obstruction or gangrene: Secondary | ICD-10-CM | POA: Diagnosis not present

## 2017-01-26 DIAGNOSIS — K635 Polyp of colon: Secondary | ICD-10-CM | POA: Diagnosis not present

## 2017-01-29 DIAGNOSIS — K635 Polyp of colon: Secondary | ICD-10-CM | POA: Diagnosis not present

## 2017-03-02 DIAGNOSIS — Z6823 Body mass index (BMI) 23.0-23.9, adult: Secondary | ICD-10-CM | POA: Diagnosis not present

## 2017-03-02 DIAGNOSIS — Z01419 Encounter for gynecological examination (general) (routine) without abnormal findings: Secondary | ICD-10-CM | POA: Diagnosis not present

## 2017-03-23 DIAGNOSIS — Z79899 Other long term (current) drug therapy: Secondary | ICD-10-CM | POA: Diagnosis not present

## 2017-03-23 DIAGNOSIS — E039 Hypothyroidism, unspecified: Secondary | ICD-10-CM | POA: Diagnosis not present

## 2017-03-23 DIAGNOSIS — E559 Vitamin D deficiency, unspecified: Secondary | ICD-10-CM | POA: Diagnosis not present

## 2017-03-23 DIAGNOSIS — E7800 Pure hypercholesterolemia, unspecified: Secondary | ICD-10-CM | POA: Diagnosis not present

## 2017-04-28 ENCOUNTER — Other Ambulatory Visit: Payer: Self-pay | Admitting: Obstetrics and Gynecology

## 2017-04-28 DIAGNOSIS — Z139 Encounter for screening, unspecified: Secondary | ICD-10-CM

## 2017-05-25 ENCOUNTER — Ambulatory Visit
Admission: RE | Admit: 2017-05-25 | Discharge: 2017-05-25 | Disposition: A | Discharge status: Home / Self Care | Payer: BLUE CROSS/BLUE SHIELD | Source: Ambulatory Visit | Attending: Obstetrics and Gynecology | Admitting: Obstetrics and Gynecology

## 2017-05-25 DIAGNOSIS — Z139 Encounter for screening, unspecified: Secondary | ICD-10-CM

## 2017-05-25 DIAGNOSIS — Z1231 Encounter for screening mammogram for malignant neoplasm of breast: Secondary | ICD-10-CM | POA: Diagnosis not present

## 2017-08-12 DIAGNOSIS — Z23 Encounter for immunization: Secondary | ICD-10-CM | POA: Diagnosis not present

## 2018-01-12 DIAGNOSIS — L309 Dermatitis, unspecified: Secondary | ICD-10-CM | POA: Diagnosis not present

## 2018-03-29 DIAGNOSIS — E039 Hypothyroidism, unspecified: Secondary | ICD-10-CM | POA: Diagnosis not present

## 2018-03-29 DIAGNOSIS — Z13220 Encounter for screening for lipoid disorders: Secondary | ICD-10-CM | POA: Diagnosis not present

## 2018-03-29 DIAGNOSIS — B360 Pityriasis versicolor: Secondary | ICD-10-CM | POA: Diagnosis not present

## 2018-03-29 DIAGNOSIS — Z79899 Other long term (current) drug therapy: Secondary | ICD-10-CM | POA: Diagnosis not present

## 2018-03-29 DIAGNOSIS — E559 Vitamin D deficiency, unspecified: Secondary | ICD-10-CM | POA: Diagnosis not present

## 2018-09-06 ENCOUNTER — Other Ambulatory Visit: Payer: Self-pay | Admitting: Obstetrics and Gynecology

## 2018-09-06 DIAGNOSIS — Z1231 Encounter for screening mammogram for malignant neoplasm of breast: Secondary | ICD-10-CM

## 2018-10-04 DIAGNOSIS — N390 Urinary tract infection, site not specified: Secondary | ICD-10-CM | POA: Diagnosis not present

## 2018-10-04 DIAGNOSIS — Z6825 Body mass index (BMI) 25.0-25.9, adult: Secondary | ICD-10-CM | POA: Diagnosis not present

## 2018-10-04 DIAGNOSIS — Z01419 Encounter for gynecological examination (general) (routine) without abnormal findings: Secondary | ICD-10-CM | POA: Diagnosis not present

## 2018-10-18 DIAGNOSIS — L728 Other follicular cysts of the skin and subcutaneous tissue: Secondary | ICD-10-CM | POA: Diagnosis not present

## 2018-10-18 DIAGNOSIS — L409 Psoriasis, unspecified: Secondary | ICD-10-CM | POA: Diagnosis not present

## 2018-10-25 ENCOUNTER — Other Ambulatory Visit: Payer: Self-pay

## 2018-10-25 ENCOUNTER — Ambulatory Visit
Admission: RE | Admit: 2018-10-25 | Discharge: 2018-10-25 | Disposition: A | Discharge status: Home / Self Care | Payer: BC Managed Care – PPO | Source: Ambulatory Visit | Attending: Obstetrics and Gynecology | Admitting: Obstetrics and Gynecology

## 2018-10-25 DIAGNOSIS — H2513 Age-related nuclear cataract, bilateral: Secondary | ICD-10-CM | POA: Diagnosis not present

## 2018-10-25 DIAGNOSIS — Z1231 Encounter for screening mammogram for malignant neoplasm of breast: Secondary | ICD-10-CM | POA: Diagnosis not present

## 2018-12-05 DIAGNOSIS — Z20828 Contact with and (suspected) exposure to other viral communicable diseases: Secondary | ICD-10-CM | POA: Diagnosis not present

## 2018-12-13 DIAGNOSIS — Z20828 Contact with and (suspected) exposure to other viral communicable diseases: Secondary | ICD-10-CM | POA: Diagnosis not present

## 2018-12-16 DIAGNOSIS — U071 COVID-19: Secondary | ICD-10-CM | POA: Diagnosis not present

## 2019-03-06 ENCOUNTER — Ambulatory Visit: Payer: BC Managed Care – PPO | Attending: Internal Medicine

## 2019-03-06 DIAGNOSIS — Z23 Encounter for immunization: Secondary | ICD-10-CM

## 2019-03-06 NOTE — Progress Notes (Signed)
   Covid-19 Vaccination Clinic  Name:  SHELLEY COCKE    MRN: [id number] DOB: 07/29/1955  03/06/2019  Ms. Dardis was observed post Covid-19 immunization for 15 minutes without incidence. She was provided with Vaccine Information Sheet and instruction to access the V-Safe system.   Ms. Jablon was instructed to call 911 with any severe reactions post vaccine: Marland Kitchen Difficulty breathing  . Swelling of your face and throat  . A fast heartbeat  . A bad rash all over your body  . Dizziness and weakness    Immunizations Administered    Name Date Dose VIS Date Route   Pfizer COVID-19 Vaccine 03/06/2019  9:17 AM 0.3 mL 12/24/2018 Intramuscular   Manufacturer: [employer], [employer]   Lot: [id number]   NDC: [address]-8

## 2019-03-29 ENCOUNTER — Ambulatory Visit: Payer: BC Managed Care – PPO

## 2019-03-30 ENCOUNTER — Ambulatory Visit: Payer: BC Managed Care – PPO | Attending: Internal Medicine

## 2019-03-30 DIAGNOSIS — Z23 Encounter for immunization: Secondary | ICD-10-CM

## 2019-03-30 NOTE — Progress Notes (Signed)
   Covid-19 Vaccination Clinic  Name:  ZARYA LASSEIGNE    MRN: [id number] DOB: [birth year]  03/30/2019  Ms. Bartleson was observed post Covid-19 immunization for 15 minutes without incident. She was provided with Vaccine Information Sheet and instruction to access the V-Safe system.   Ms. Ohagan was instructed to call 911 with any severe reactions post vaccine: Marland Kitchen Difficulty breathing  . Swelling of face and throat  . A fast heartbeat  . A bad rash all over body  . Dizziness and weakness   Immunizations Administered    Name Date Dose VIS Date Route   Pfizer COVID-19 Vaccine 03/30/2019  8:39 AM 0.3 mL 12/24/2018 Intramuscular   Manufacturer: [employer], [employer]   Lot: [id number]   NDC: [address]-6

## 2019-04-25 DIAGNOSIS — Z13220 Encounter for screening for lipoid disorders: Secondary | ICD-10-CM | POA: Diagnosis not present

## 2019-04-25 DIAGNOSIS — E559 Vitamin D deficiency, unspecified: Secondary | ICD-10-CM | POA: Diagnosis not present

## 2019-04-25 DIAGNOSIS — R0602 Shortness of breath: Secondary | ICD-10-CM | POA: Diagnosis not present

## 2019-04-25 DIAGNOSIS — E039 Hypothyroidism, unspecified: Secondary | ICD-10-CM | POA: Diagnosis not present

## 2019-04-25 DIAGNOSIS — Z8616 Personal history of COVID-19: Secondary | ICD-10-CM | POA: Diagnosis not present

## 2019-04-25 DIAGNOSIS — Z79899 Other long term (current) drug therapy: Secondary | ICD-10-CM | POA: Diagnosis not present

## 2019-11-17 ENCOUNTER — Other Ambulatory Visit: Payer: Self-pay | Admitting: Obstetrics and Gynecology

## 2019-11-17 DIAGNOSIS — Z1231 Encounter for screening mammogram for malignant neoplasm of breast: Secondary | ICD-10-CM

## 2019-12-02 DIAGNOSIS — Z20822 Contact with and (suspected) exposure to covid-19: Secondary | ICD-10-CM | POA: Diagnosis not present

## 2020-01-23 ENCOUNTER — Ambulatory Visit: Payer: BC Managed Care – PPO

## 2020-01-23 DIAGNOSIS — Z01419 Encounter for gynecological examination (general) (routine) without abnormal findings: Secondary | ICD-10-CM | POA: Diagnosis not present

## 2020-01-23 DIAGNOSIS — Z6825 Body mass index (BMI) 25.0-25.9, adult: Secondary | ICD-10-CM | POA: Diagnosis not present

## 2020-01-23 DIAGNOSIS — Z13820 Encounter for screening for osteoporosis: Secondary | ICD-10-CM | POA: Diagnosis not present

## 2020-02-27 ENCOUNTER — Other Ambulatory Visit: Payer: Self-pay | Admitting: Family Medicine

## 2020-02-27 ENCOUNTER — Other Ambulatory Visit (HOSPITAL_COMMUNITY)
Admission: RE | Admit: 2020-02-27 | Discharge: 2020-02-27 | Disposition: A | Discharge status: Home / Self Care | Payer: BC Managed Care – PPO | Source: Ambulatory Visit | Attending: Family Medicine | Admitting: Family Medicine

## 2020-02-27 DIAGNOSIS — E039 Hypothyroidism, unspecified: Secondary | ICD-10-CM | POA: Diagnosis not present

## 2020-02-27 DIAGNOSIS — M71572 Other bursitis, not elsewhere classified, left ankle and foot: Secondary | ICD-10-CM | POA: Diagnosis not present

## 2020-02-27 DIAGNOSIS — S90852A Superficial foreign body, left foot, initial encounter: Secondary | ICD-10-CM | POA: Diagnosis not present

## 2020-02-27 DIAGNOSIS — Z13220 Encounter for screening for lipoid disorders: Secondary | ICD-10-CM | POA: Diagnosis not present

## 2020-02-27 DIAGNOSIS — E559 Vitamin D deficiency, unspecified: Secondary | ICD-10-CM | POA: Diagnosis not present

## 2020-02-27 DIAGNOSIS — R0609 Other forms of dyspnea: Secondary | ICD-10-CM | POA: Diagnosis not present

## 2020-02-27 DIAGNOSIS — Z01411 Encounter for gynecological examination (general) (routine) with abnormal findings: Secondary | ICD-10-CM | POA: Insufficient documentation

## 2020-02-27 DIAGNOSIS — Z79899 Other long term (current) drug therapy: Secondary | ICD-10-CM | POA: Diagnosis not present

## 2020-02-27 DIAGNOSIS — B070 Plantar wart: Secondary | ICD-10-CM | POA: Diagnosis not present

## 2020-02-27 DIAGNOSIS — L659 Nonscarring hair loss, unspecified: Secondary | ICD-10-CM | POA: Diagnosis not present

## 2020-02-27 DIAGNOSIS — E538 Deficiency of other specified B group vitamins: Secondary | ICD-10-CM | POA: Diagnosis not present

## 2020-03-01 ENCOUNTER — Telehealth: Payer: Self-pay

## 2020-03-01 NOTE — Telephone Encounter (Signed)
NOTES ON FILE FROM [facility] [phone], SENT REFERRAL TO SCHEDULING 

## 2020-03-02 DIAGNOSIS — E538 Deficiency of other specified B group vitamins: Secondary | ICD-10-CM | POA: Diagnosis not present

## 2020-03-02 LAB — CYTOLOGY - PAP: Diagnosis: NEGATIVE

## 2020-03-16 DIAGNOSIS — E538 Deficiency of other specified B group vitamins: Secondary | ICD-10-CM | POA: Diagnosis not present

## 2020-03-19 ENCOUNTER — Other Ambulatory Visit: Payer: Self-pay

## 2020-03-19 ENCOUNTER — Ambulatory Visit: Payer: BC Managed Care – PPO

## 2020-03-19 ENCOUNTER — Ambulatory Visit
Admission: RE | Admit: 2020-03-19 | Discharge: 2020-03-19 | Disposition: A | Discharge status: Home / Self Care | Payer: BC Managed Care – PPO | Source: Ambulatory Visit | Attending: Obstetrics and Gynecology | Admitting: Obstetrics and Gynecology

## 2020-03-19 DIAGNOSIS — Z1231 Encounter for screening mammogram for malignant neoplasm of breast: Secondary | ICD-10-CM | POA: Diagnosis not present

## 2020-03-26 ENCOUNTER — Encounter: Payer: Self-pay | Admitting: Plastic Surgery

## 2020-03-26 ENCOUNTER — Other Ambulatory Visit: Payer: Self-pay

## 2020-03-26 ENCOUNTER — Ambulatory Visit (INDEPENDENT_AMBULATORY_CARE_PROVIDER_SITE_OTHER): Payer: Self-pay | Admitting: Plastic Surgery

## 2020-03-26 DIAGNOSIS — Z719 Counseling, unspecified: Secondary | ICD-10-CM

## 2020-03-26 NOTE — Progress Notes (Signed)

## 2020-04-09 ENCOUNTER — Other Ambulatory Visit: Payer: Self-pay

## 2020-04-09 ENCOUNTER — Encounter: Payer: Self-pay | Admitting: Cardiovascular Disease

## 2020-04-09 ENCOUNTER — Ambulatory Visit: Payer: BC Managed Care – PPO | Admitting: Cardiovascular Disease

## 2020-04-09 VITALS — BP 118/80 | HR 73 | Ht 65.0 in | Wt 144.0 lb

## 2020-04-09 DIAGNOSIS — R0602 Shortness of breath: Secondary | ICD-10-CM | POA: Diagnosis not present

## 2020-04-09 DIAGNOSIS — R0609 Other forms of dyspnea: Secondary | ICD-10-CM

## 2020-04-09 DIAGNOSIS — R011 Cardiac murmur, unspecified: Secondary | ICD-10-CM | POA: Diagnosis not present

## 2020-04-09 DIAGNOSIS — R0600 Dyspnea, unspecified: Secondary | ICD-10-CM

## 2020-04-09 NOTE — Progress Notes (Signed)
Cardiology Office Note:    Date:  04/09/2020   ID:  [name], DOB [birth year], MRN [id number]  PCP:  Sarah Palmer, MD   [facility]  Cardiologist:  Nahser  Advanced Practice Provider:  No care team member to display Electrophysiologist:  None   Referring MD: Sarah Palmer, MD   Chief Complaint  Patient presents with  . Shortness of Breath     April 09, 2020   Sarah [name] is a 65 y.o. female with a hx of  Dyspnea and hypothyroidism.  We were asked to see her for further evaluation of her dyspnea by Dr. Paulino Rily   Had covid ( she thinks, no testing available )  in Jan. 2020.   Severe HA   Was in [place] Had a second episode of COVID in Nov 2020   Has had wosening dyspnea since having the 2nd episode of covid Also has hiatal hernia  Has lots of chest pressure related to her HH.   Dyspnea has been getting better  Taking B 12 supplements which has helped - she is very B 12 deficient .   Exercises 5 days a day  Treadmill 3.8 mph for 10 min,  Lifts weights for 20 min  Limited by knee pain     Past Medical History:  Diagnosis Date  . Anemia    Hx: of during pregnancy  . DOE (dyspnea on exertion)   . Hair loss   . Hypothyroidism   . Other long term (current) drug therapy   . Vitamin D deficiency     Past Surgical History:  Procedure Laterality Date  . ANTERIOR CERVICAL DECOMP/DISCECTOMY FUSION N/A 04/29/2012   Procedure: ANTERIOR CERVICAL DECOMPRESSION/DISCECTOMY FUSION 1 LEVEL;  Surgeon: Maeola Harman, MD;  Location: [facility];  Service: Neurosurgery;  Laterality: N/A;  Cervical three-four anterior cervical decompression with fusion plating and bonegraft  . colonocopy    . COLONOSCOPY W/ BIOPSIES AND POLYPECTOMY     Hx: of    Current Medications: Current Meds  Medication Sig  . Cyanocobalamin (B-12 COMPLIANCE INJECTION IJ) Inject 1 Dose as directed every 14 (fourteen) days.  Marland Kitchen levothyroxine (SYNTHROID, LEVOTHROID)  112 MCG tablet Take 112 mcg by mouth daily before breakfast.  . Multiple Vitamin (MULTIVITAMIN ADULT) TABS Take 1 tablet by mouth daily.  Marland Kitchen oxyCODONE-acetaminophen (PERCOCET/ROXICET) 5-325 MG per tablet Take 1-2 tablets by mouth every 4 (four) hours as needed for pain.  . Vitamin D, Ergocalciferol, (DRISDOL) 50000 UNITS CAPS Take 50,000 Units by mouth every Monday.     Allergies:   Patient has no known allergies.   Social History   Socioeconomic History  . Marital status: Married    Spouse name: Not on file  . Number of children: Not on file  . Years of education: Not on file  . Highest education level: Not on file  Occupational History  . Not on file  Tobacco Use  . Smoking status: Former Smoker    Quit date: 04/14/2002    Years since quitting: 18.0  . Smokeless tobacco: Never Used  Substance and Sexual Activity  . Alcohol use: Yes    Comment: occassional  . Drug use: No  . Sexual activity: Yes  Other Topics Concern  . Not on file  Social History Narrative  . Not on file   Social Determinants of Health   Financial Resource Strain: Not on file  Food Insecurity: Not on file  Transportation Needs: Not on file  Physical  Activity: Not on file  Stress: Not on file  Social Connections: Not on file     Family History: The patient's family history includes Kidney cancer in her brother.  ROS:   Please see the history of present illness.     All other systems reviewed and are negative.  EKGs/Labs/Other Studies Reviewed:    The following studies were reviewed today:    EKG:  April 09, 2020<  NSR at 8.  No ST or T changes.   Recent Labs: No results found for requested labs within last 8760 hours.  Recent Lipid Panel No results found for: CHOL, TRIG, HDL, CHOLHDL, VLDL, LDLCALC, LDLDIRECT   Risk Assessment/Calculations:       Physical Exam:    VS:  BP 118/80   Pulse 73   Ht 5\' 5"  (1.651 m)   Wt 144 lb (65.3 kg)   SpO2 100%   BMI 23.96 kg/m     Wt  Readings from Last 3 Encounters:  04/09/20 144 lb (65.3 kg)  04/29/12 139 lb 12.4 oz (63.4 kg)     GEN:  Well nourished, well developed in no acute distress HEENT: Normal NECK: No JVD; No carotid bruits LYMPHATICS: No lymphadenopathy CARDIAC: RRR, , very soft murmur  RESPIRATORY:  Clear to auscultation without rales, wheezing or rhonchi  ABDOMEN: Soft, non-tender, non-distended MUSCULOSKELETAL:  No edema; No deformity  SKIN: Warm and dry NEUROLOGIC:  Alert and oriented x 3 PSYCHIATRIC:  Normal affect   ASSESSMENT:    1. Dyspnea on exertion    PLAN:    In order of problems listed above:  1. DOe:   Has DOE on occasion She exercises on a fairly regular basis without any chest pain or shortness breath but does note shortness breath when she climbs stairs particularly if she is carrying something heavy.  She does have a soft murmur.  We will get an echocardiogram for further evaluation.  We will discuss the results of her echo by phone and I will see her on an as-needed basis.  Overall I think she is fairly healthy and I encouraged her to continue exercising.  I have suggested that she start using an elliptical machine or stationary bike instead of her treadmill.  She is having some knee pain and the elliptical or bike might relieve her pain better than doing the treadmill.        Medication Adjustments/Labs and Tests Ordered: Current medicines are reviewed at length with the patient today.  Concerns regarding medicines are outlined above.  Orders Placed This Encounter  Procedures  . EKG 12-Lead  . ECHOCARDIOGRAM COMPLETE   No orders of the defined types were placed in this encounter.   Patient Instructions  Medication Instructions:  Your physician recommends that you continue on your current medications as directed. Please refer to the Current Medication list given to you today.  *If you need a refill on your cardiac medications before your next appointment, please  call your pharmacy*   Lab Work: none    Testing/Procedures: Your physician has requested that you have an echocardiogram. Echocardiography is a painless test that uses sound waves to create images of your heart. It provides your doctor with information about the size and shape of your heart and how well your heart's chambers and valves are working. This procedure takes approximately one hour. There are no restrictions for this procedure.   Follow-Up: At [facility], you and your health needs are our priority.  As part of  our continuing mission to provide you with exceptional heart care, we have created designated Provider Care Teams.  These Care Teams include your primary Cardiologist (physician) and Advanced Practice Providers (APPs -  Physician Assistants and Nurse Practitioners) who all work together to provide you with the care you need, when you need it.  We recommend signing up for the patient portal called "MyChart".  Sign up information is provided on this After Visit Summary.  MyChart is used to connect with patients for Virtual Visits (Telemedicine).  Patients are able to view lab/test results, encounter notes, upcoming appointments, etc.  Non-urgent messages can be sent to your provider as well.   To learn more about what you can do with MyChart, go to [email or web address].    Your next appointment:    As needed  The format for your next appointment:   In Person  Provider:   Kristeen Miss, MD      Signed, Kristeen Miss, MD  04/09/2020 5:36 PM    [facility]

## 2020-04-09 NOTE — Patient Instructions (Signed)
Medication Instructions:  Your physician recommends that you continue on your current medications as directed. Please refer to the Current Medication list given to you today.  *If you need a refill on your cardiac medications before your next appointment, please call your pharmacy*   Lab Work: none    Testing/Procedures: Your physician has requested that you have an echocardiogram. Echocardiography is a painless test that uses sound waves to create images of your heart. It provides your doctor with information about the size and shape of your heart and how well your heart's chambers and valves are working. This procedure takes approximately one hour. There are no restrictions for this procedure.   Follow-Up: At [facility], you and your health needs are our priority.  As part of our continuing mission to provide you with exceptional heart care, we have created designated Provider Care Teams.  These Care Teams include your primary Cardiologist (physician) and Advanced Practice Providers (APPs -  Physician Assistants and Nurse Practitioners) who all work together to provide you with the care you need, when you need it.  We recommend signing up for the patient portal called "MyChart".  Sign up information is provided on this After Visit Summary.  MyChart is used to connect with patients for Virtual Visits (Telemedicine).  Patients are able to view lab/test results, encounter notes, upcoming appointments, etc.  Non-urgent messages can be sent to your provider as well.   To learn more about what you can do with MyChart, go to [email or web address].    Your next appointment:    As needed  The format for your next appointment:   In Person  Provider:   Kristeen Miss, MD

## 2020-05-07 ENCOUNTER — Other Ambulatory Visit: Payer: Self-pay

## 2020-05-07 ENCOUNTER — Ambulatory Visit (HOSPITAL_COMMUNITY): Payer: Medicare Other | Attending: Cardiology

## 2020-05-07 DIAGNOSIS — R0600 Dyspnea, unspecified: Secondary | ICD-10-CM | POA: Diagnosis not present

## 2020-05-07 DIAGNOSIS — R0609 Other forms of dyspnea: Secondary | ICD-10-CM

## 2020-05-07 LAB — ECHOCARDIOGRAM COMPLETE
Area-P 1/2: 6.02 cm2
P 1/2 time: 312 msec
S' Lateral: 2.7 cm

## 2020-06-28 ENCOUNTER — Other Ambulatory Visit: Payer: Self-pay | Admitting: Neurosurgery

## 2020-06-28 DIAGNOSIS — M5412 Radiculopathy, cervical region: Secondary | ICD-10-CM

## 2020-07-15 ENCOUNTER — Ambulatory Visit
Admission: RE | Admit: 2020-07-15 | Discharge: 2020-07-15 | Disposition: A | Discharge status: Home / Self Care | Payer: Medicare Other | Source: Ambulatory Visit | Attending: Neurosurgery | Admitting: Neurosurgery

## 2020-07-15 ENCOUNTER — Other Ambulatory Visit: Payer: Self-pay

## 2020-07-15 DIAGNOSIS — M5412 Radiculopathy, cervical region: Secondary | ICD-10-CM

## 2020-07-15 MED ORDER — GADOBENATE DIMEGLUMINE 529 MG/ML IV SOLN
10.0000 mL | Freq: Once | INTRAVENOUS | Status: AC | PRN
  Administered 2020-07-15: 10 mL via INTRAVENOUS

## 2020-10-29 ENCOUNTER — Ambulatory Visit: Payer: Self-pay | Admitting: Plastic Surgery

## 2020-10-29 ENCOUNTER — Encounter: Payer: Self-pay | Admitting: Plastic Surgery

## 2020-10-29 ENCOUNTER — Other Ambulatory Visit: Payer: Self-pay

## 2020-10-29 DIAGNOSIS — Z719 Counseling, unspecified: Secondary | ICD-10-CM

## 2020-10-29 NOTE — Progress Notes (Signed)

## 2020-12-14 ENCOUNTER — Encounter (HOSPITAL_BASED_OUTPATIENT_CLINIC_OR_DEPARTMENT_OTHER): Payer: Self-pay | Admitting: Emergency Medicine

## 2020-12-14 ENCOUNTER — Other Ambulatory Visit: Payer: Self-pay

## 2020-12-14 ENCOUNTER — Emergency Department (HOSPITAL_BASED_OUTPATIENT_CLINIC_OR_DEPARTMENT_OTHER)
Admission: EM | Admit: 2020-12-14 | Discharge: 2020-12-14 | Disposition: A | Discharge status: Home / Self Care | Payer: Medicare Other | Attending: Emergency Medicine | Admitting: Emergency Medicine

## 2020-12-14 DIAGNOSIS — H9201 Otalgia, right ear: Secondary | ICD-10-CM | POA: Diagnosis present

## 2020-12-14 DIAGNOSIS — E039 Hypothyroidism, unspecified: Secondary | ICD-10-CM | POA: Diagnosis not present

## 2020-12-14 DIAGNOSIS — Z87891 Personal history of nicotine dependence: Secondary | ICD-10-CM | POA: Diagnosis not present

## 2020-12-14 DIAGNOSIS — Z79899 Other long term (current) drug therapy: Secondary | ICD-10-CM | POA: Diagnosis not present

## 2020-12-14 DIAGNOSIS — H65191 Other acute nonsuppurative otitis media, right ear: Secondary | ICD-10-CM | POA: Insufficient documentation

## 2020-12-14 MED ORDER — AMOXICILLIN-POT CLAVULANATE 875-125 MG PO TABS: 1.0000 | ORAL_TABLET | Freq: Two times a day (BID) | ORAL | 0 refills | Status: AC

## 2020-12-14 MED ORDER — AMOXICILLIN-POT CLAVULANATE 875-125 MG PO TABS
1.0000 | ORAL_TABLET | Freq: Once | ORAL | Status: AC
  Administered 2020-12-14: 1 via ORAL
  Filled 2020-12-14: qty 1

## 2020-12-14 NOTE — ED Notes (Signed)
Pt discharged home after verbalizing understanding of discharge instructions; nad noted. 

## 2020-12-14 NOTE — Discharge Instructions (Signed)
You have been diagnosed with an ear infection  Please take all of your antibiotics until finished!   You may develop abdominal discomfort or diarrhea from the antibiotic.  You may help offset this with probiotics which you can buy or get in yogurt. Do not eat  or take the probiotics until 2 hours after your antibiotic.

## 2020-12-14 NOTE — ED Provider Notes (Signed)
[facility] EMERGENCY DEPT Provider Note   CSN: [id number] Arrival date & time: 12/14/20  1831     History Chief Complaint  Patient presents with   Otalgia    Sarah [name] is a 65 y.o. female.  Patient states that she had a recent upper respiratory infection.  She still recovering this, however she has developed right ear pain for the past 7 hours.  This has been constant.  She has no associated fevers.  Denies any ear drainage.     Otalgia Associated symptoms: no abdominal pain, no congestion, no cough, no ear discharge, no fever, no rash, no sore throat and no vomiting       Past Medical History:  Diagnosis Date   Anemia    Hx: of during pregnancy   DOE (dyspnea on exertion)    Hair loss    Hypothyroidism    Other long term (current) drug therapy    Vitamin D deficiency     Patient Active Problem List   Diagnosis Date Noted   Dyspnea 04/09/2020   Murmur 04/09/2020   Encounter for counseling 03/26/2020    Past Surgical History:  Procedure Laterality Date   ANTERIOR CERVICAL DECOMP/DISCECTOMY FUSION N/A 04/29/2012   Procedure: ANTERIOR CERVICAL DECOMPRESSION/DISCECTOMY FUSION 1 LEVEL;  Surgeon: Maeola Harman, MD;  Location: [facility];  Service: Neurosurgery;  Laterality: N/A;  Cervical three-four anterior cervical decompression with fusion plating and bonegraft   colonocopy     COLONOSCOPY W/ BIOPSIES AND POLYPECTOMY     Hx: of     OB History   No obstetric history on file.     Family History  Problem Relation Age of Onset   Kidney cancer Brother     Social History   Tobacco Use   Smoking status: Former    Types: Cigarettes    Quit date: 04/14/2002    Years since quitting: 18.6   Smokeless tobacco: Never  Substance Use Topics   Alcohol use: Yes    Comment: occassional   Drug use: No    Home Medications Prior to Admission medications   Medication Sig Start Date End Date Taking? Authorizing Provider  amoxicillin-clavulanate  (AUGMENTIN) 875-125 MG tablet Take 1 tablet by mouth 2 (two) times daily for 7 days. 12/14/20 12/21/20 Yes Dreydon Cardenas, Finis Bud, PA-C  Cyanocobalamin (B-12 COMPLIANCE INJECTION IJ) Inject 1 Dose as directed every 14 (fourteen) days.    [provider]  levothyroxine (SYNTHROID, LEVOTHROID) 112 MCG tablet Take 112 mcg by mouth daily before breakfast.    [provider]  Multiple Vitamin (MULTIVITAMIN ADULT) TABS Take 1 tablet by mouth daily.    [provider]  oxyCODONE-acetaminophen (PERCOCET/ROXICET) 5-325 MG per tablet Take 1-2 tablets by mouth every 4 (four) hours as needed for pain. 04/30/12   Maeola Harman, MD  Vitamin D, Ergocalciferol, (DRISDOL) 50000 UNITS CAPS Take 50,000 Units by mouth every Monday.    [provider]    Allergies    Contrast media [iodinated diagnostic agents]  Review of Systems   Review of Systems  Constitutional:  Negative for chills and fever.  HENT:  Positive for ear pain. Negative for congestion, ear discharge and sore throat.   Eyes:  Negative for pain and visual disturbance.  Respiratory:  Negative for cough and shortness of breath.   Cardiovascular:  Negative for chest pain and palpitations.  Gastrointestinal:  Negative for abdominal pain and vomiting.  Genitourinary:  Negative for dysuria and hematuria.  Musculoskeletal:  Negative for  arthralgias and back pain.  Skin:  Negative for color change and rash.  Neurological:  Negative for seizures and syncope.  All other systems reviewed and are negative.  Physical Exam Updated Vital Signs BP (!) 156/94 (BP Location: Right Arm)   Pulse 86   Temp 98.3 F (36.8 C) (Oral)   Resp 18   Ht 5\' 6"  (1.676 m)   Wt 64.9 kg   SpO2 98%   BMI 23.08 kg/m   Physical Exam Vitals and nursing note reviewed.  Constitutional:      General: She is not in acute distress.    Appearance: Normal appearance. She is well-developed. She is not ill-appearing, toxic-appearing or diaphoretic.   HENT:     Head: Normocephalic and atraumatic.     Right Ear: Tenderness present. No drainage or swelling. No foreign body. No mastoid tenderness. No PE tube. No hemotympanum. Tympanic membrane is erythematous. Tympanic membrane is not perforated.     Left Ear: Hearing, tympanic membrane, ear canal and external ear normal.     Nose: No nasal deformity.     Mouth/Throat:     Lips: Pink. No lesions.     Mouth: Mucous membranes are moist.     Pharynx: Oropharynx is clear. Uvula midline. No pharyngeal swelling, oropharyngeal exudate, posterior oropharyngeal erythema or uvula swelling.     Tonsils: No tonsillar exudate or tonsillar abscesses.  Eyes:     General: Gaze aligned appropriately. No scleral icterus.       Right eye: No discharge.        Left eye: No discharge.     Conjunctiva/sclera: Conjunctivae normal.     Right eye: Right conjunctiva is not injected. No exudate or hemorrhage.    Left eye: Left conjunctiva is not injected. No exudate or hemorrhage. Pulmonary:     Effort: Pulmonary effort is normal. No respiratory distress.  Skin:    General: Skin is warm and dry.  Neurological:     Mental Status: She is alert and oriented to person, place, and time.  Psychiatric:        Mood and Affect: Mood normal.        Speech: Speech normal.        Behavior: Behavior normal. Behavior is cooperative.    ED Results / Procedures / Treatments   Labs (all labs ordered are listed, but only abnormal results are displayed) Labs Reviewed - No data to display  EKG None  Radiology No results found.  Procedures Procedures   Medications Ordered in ED Medications  amoxicillin-clavulanate (AUGMENTIN) 875-125 MG per tablet 1 tablet (1 tablet Oral Given 12/14/20 2100)    ED Course  I have reviewed the triage vital signs and the nursing notes.  Pertinent labs & imaging results that were available during my care of the patient were reviewed by me and considered in my medical decision making  (see chart for details).    MDM Rules/Calculators/A&P                         Vital stable. Right ear consistent with otitis media.  TM is intact.  No external ear erythema or swelling.  There are no signs of mastoiditis.  No drainage.  HEENT exam otherwise normal.  Prescribed Augmentin for 7 days.   Final Clinical Impression(s) / ED Diagnoses Final diagnoses:  Other non-recurrent acute nonsuppurative otitis media of right ear    Rx / DC Orders ED Discharge Orders  Ordered    amoxicillin-clavulanate (AUGMENTIN) 875-125 MG tablet  2 times daily        12/14/20 2041             Sheila Oats 12/14/20 2252    Regan Lemming, MD 12/15/20 1651

## 2020-12-14 NOTE — ED Triage Notes (Signed)
Pt presents to ED POV. Pt c/o R ear pain. Pt reports that she had URI s/s this week. Was tested for covid/flu on Monday was negative. Told by PCP it was a virus.

## 2020-12-14 NOTE — ED Notes (Signed)
Pt from home with ear pain that began today around 1630. Pt states she was feeling achy and not well over the weekend and was tested for covid and strep; both were negative. She was not tested for flu. She reports that she began feeling better but had uri symptoms beginning yesterday. She had a nosebleed last night and today for very short periods.

## 2021-01-30 ENCOUNTER — Other Ambulatory Visit: Payer: Self-pay | Admitting: Family Medicine

## 2021-01-30 DIAGNOSIS — E2839 Other primary ovarian failure: Secondary | ICD-10-CM

## 2021-02-27 ENCOUNTER — Other Ambulatory Visit: Payer: Self-pay | Admitting: Family Medicine

## 2021-02-27 DIAGNOSIS — Z1231 Encounter for screening mammogram for malignant neoplasm of breast: Secondary | ICD-10-CM

## 2021-06-17 ENCOUNTER — Ambulatory Visit: Payer: Medicare Other

## 2021-06-17 ENCOUNTER — Encounter: Payer: Medicare Other | Admitting: Plastic Surgery

## 2021-06-24 ENCOUNTER — Ambulatory Visit
Admission: RE | Admit: 2021-06-24 | Discharge: 2021-06-24 | Disposition: A | Discharge status: Home / Self Care | Payer: Medicare Other | Source: Ambulatory Visit | Attending: Family Medicine | Admitting: Family Medicine

## 2021-06-24 ENCOUNTER — Encounter: Payer: Self-pay | Admitting: Plastic Surgery

## 2021-06-24 ENCOUNTER — Ambulatory Visit: Payer: Self-pay | Admitting: Plastic Surgery

## 2021-06-24 DIAGNOSIS — Z719 Counseling, unspecified: Secondary | ICD-10-CM

## 2021-06-24 DIAGNOSIS — Z1231 Encounter for screening mammogram for malignant neoplasm of breast: Secondary | ICD-10-CM

## 2021-06-24 NOTE — Progress Notes (Signed)

## 2021-10-02 IMAGING — MG MM DIGITAL SCREENING BILAT W/ TOMO AND CAD
6 of 10 series · 6 of 30 positions shown · non-contrast
Comparison: Previous exam(s).

CLINICAL DATA: Screening.

EXAM:
DIGITAL SCREENING BILATERAL MAMMOGRAM WITH TOMOSYNTHESIS AND CAD
TECHNIQUE: Bilateral screening digital craniocaudal and mediolateral oblique
mammograms were obtained. Bilateral screening digital breast
tomosynthesis was performed. The images were evaluated with
computer-aided detection.

[R CC synth-2D (1 of 2)]
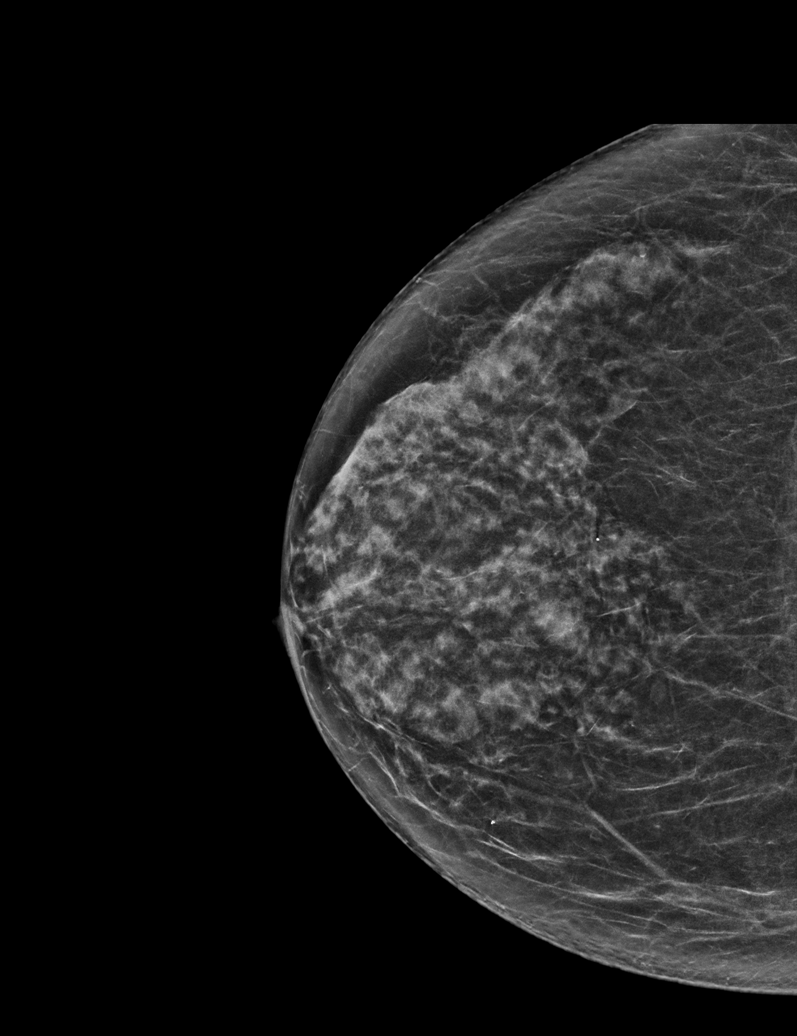

[R MLO synth-2D]
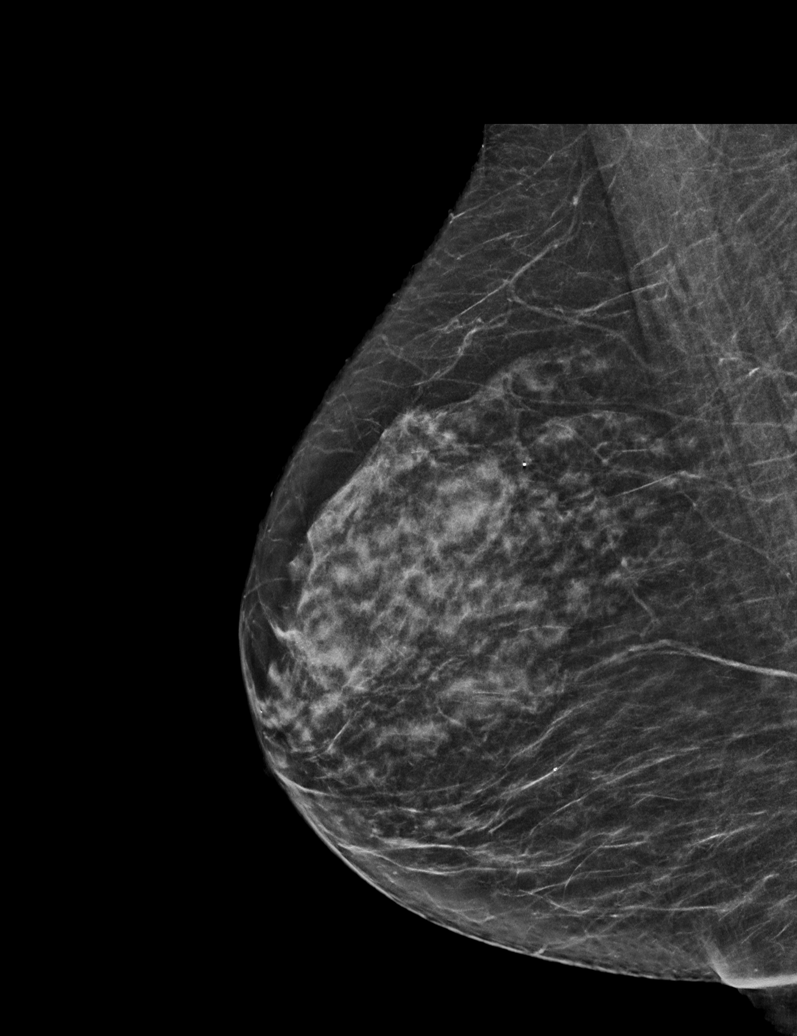

[L MLO synth-2D]
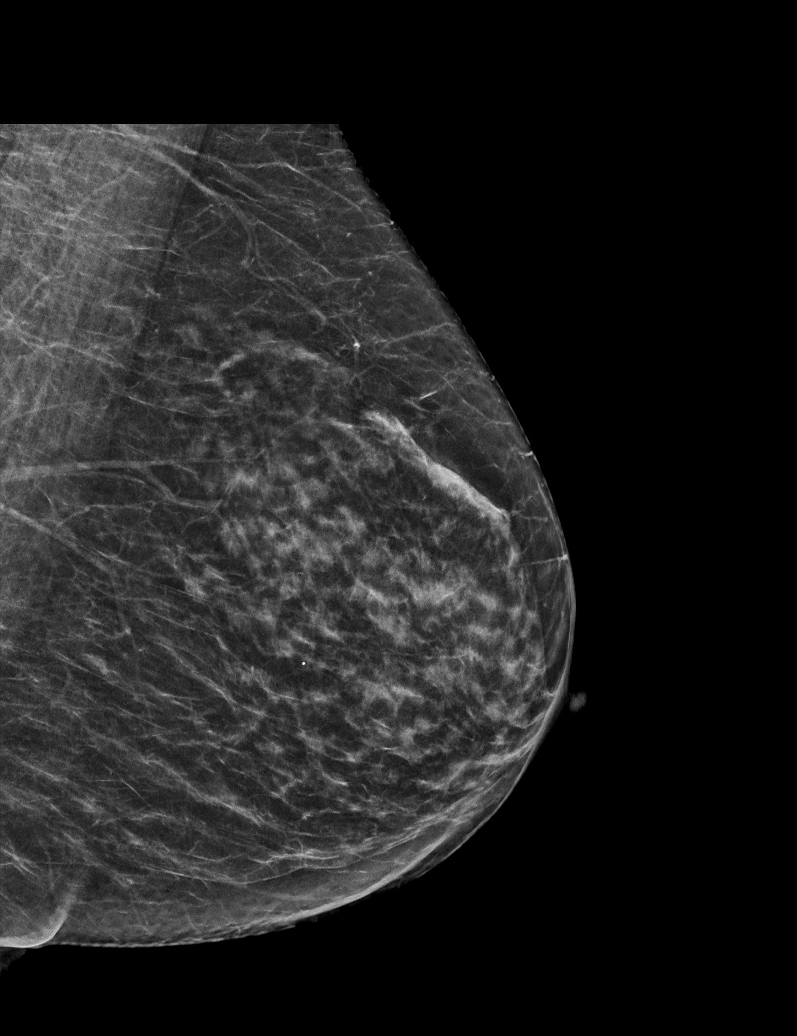

[L CC synth-2D]
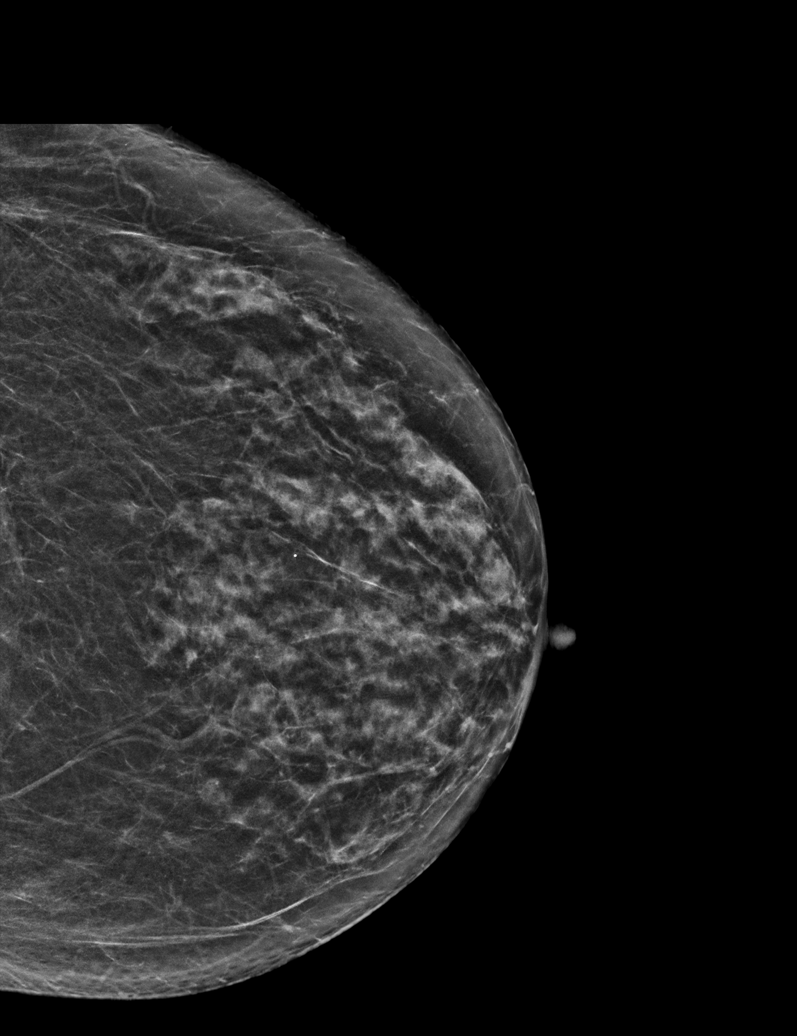

[R CC synth-2D (2 of 2)]
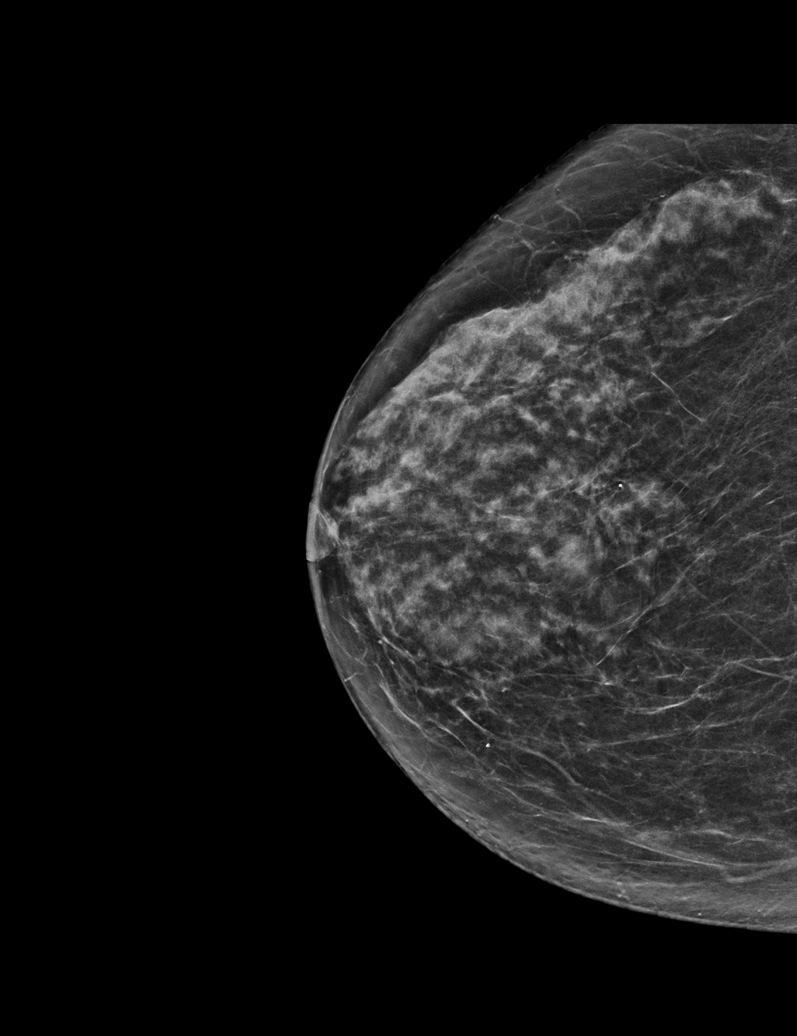

[R MLO tomo · tomo slice 29/56.0]
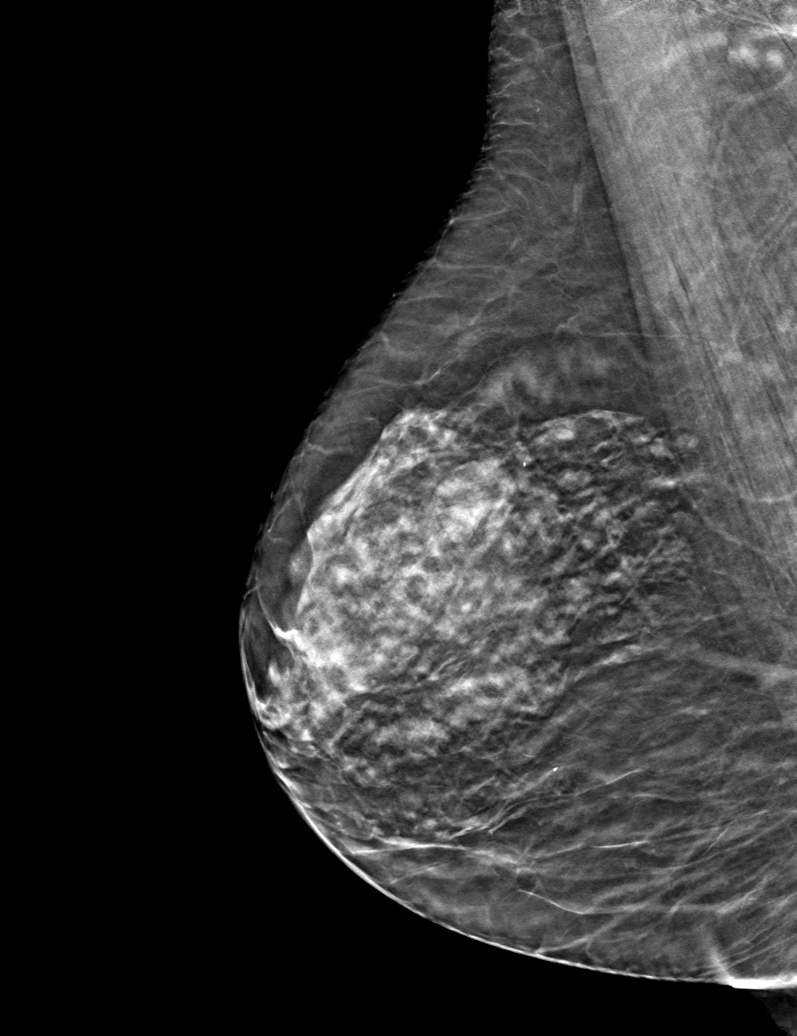

[6 of 30 positions shown; findings below may reference images not displayed]

ACR Breast Density Category c: The breast tissue is heterogeneously
dense, which may obscure small masses.
FINDINGS: There are no findings suspicious for malignancy. The images were
evaluated with computer-aided detection.
IMPRESSION: No mammographic evidence of malignancy. A result letter of this
screening mammogram will be mailed directly to the patient.

RECOMMENDATION:
Screening mammogram in one year. (Code:T4-5-GWO)

BI-RADS CATEGORY  1: Negative.

## 2021-11-04 ENCOUNTER — Encounter: Payer: Self-pay | Admitting: Plastic Surgery

## 2021-11-04 ENCOUNTER — Ambulatory Visit (INDEPENDENT_AMBULATORY_CARE_PROVIDER_SITE_OTHER): Payer: Self-pay | Admitting: Plastic Surgery

## 2021-11-04 DIAGNOSIS — Z719 Counseling, unspecified: Secondary | ICD-10-CM

## 2021-11-04 NOTE — Progress Notes (Signed)
Botulinum Toxin Procedure Note  Procedure: Cosmetic botulinum toxin   Pre-operative Diagnosis: Dynamic rhytides   Post-operative Diagnosis: Same  Complications:  None  Brief history: The patient desires botulinum toxin injection of her forehead. I discussed with the patient this proposed procedure of botulinum toxin injections, which is customized depending on the particular needs of the patient. It is performed on facial rhytids as a temporary correction. The alternatives were discussed with the patient. The risks were addressed including bleeding, scarring, infection, damage to deeper structures, asymmetry, and chronic pain, which may occur infrequently after a procedure. The individual's choice to undergo a surgical procedure is based on the comparison of risks to potential benefits. Other risks include unsatisfactory results, brow ptosis, eyelid ptosis, allergic reaction, temporary paralysis, which should go away with time, bruising, blurring disturbances and delayed healing. Botulinum toxin injections do not arrest the aging process or produce permanent tightening of the eyelid.  Operative intervention maybe necessary to maintain the results of a blepharoplasty or botulinum toxin. The patient understands and wishes to proceed.  Procedure: The area was prepped with alcohol and dried with a clean gauze. Using a clean technique, the botulinum toxin was diluted with 1.25 cc of preservative-free normal saline which was slowly injected with an 18 gauge needle in a tuberculin syringes.  A 32 gauge needles were then used to inject the botulinum toxin. This mixture allow for an aliquot of 4 units per 0.1 cc in each injection site.    Subsequently the mixture was injected in the glabellar and forehead area with preservation of the temporal branch to the lateral eyebrow as well as into each lateral canthal area beginning from the lateral orbital rim medial to the zygomaticus major in 3 separate areas. A  total of 38 Units of botulinum toxin was used. The forehead and glabellar area was injected with care to inject intramuscular only while holding pressure on the supratrochlear vessels in each area during each injection on either side of the medial corrugators. The injection proceeded vertically superiorly to the medial 2/3 of the frontalis muscle and superior 2/3 of the lateral frontalis, again with preservation of the frontal branch. No complications were noted. Light pressure was held for 5 minutes. She was instructed explicitly in post-operative care.  Botox LOT:  [id number]  EXP:  2025/11  

## 2022-07-28 ENCOUNTER — Encounter: Payer: Self-pay | Admitting: Plastic Surgery

## 2022-07-28 ENCOUNTER — Ambulatory Visit (INDEPENDENT_AMBULATORY_CARE_PROVIDER_SITE_OTHER): Payer: Self-pay | Admitting: Plastic Surgery

## 2022-07-28 DIAGNOSIS — Z719 Counseling, unspecified: Secondary | ICD-10-CM

## 2022-07-28 NOTE — Progress Notes (Signed)

## 2022-07-30 ENCOUNTER — Other Ambulatory Visit: Payer: Self-pay | Admitting: Obstetrics and Gynecology

## 2022-07-30 DIAGNOSIS — Z1231 Encounter for screening mammogram for malignant neoplasm of breast: Secondary | ICD-10-CM

## 2022-08-13 ENCOUNTER — Encounter: Payer: Self-pay | Admitting: *Deleted

## 2022-09-22 ENCOUNTER — Ambulatory Visit
Admission: RE | Admit: 2022-09-22 | Discharge: 2022-09-22 | Disposition: A | Discharge status: Home / Self Care | Payer: Medicare Other | Source: Ambulatory Visit | Attending: Obstetrics and Gynecology | Admitting: Obstetrics and Gynecology

## 2022-09-22 DIAGNOSIS — Z1231 Encounter for screening mammogram for malignant neoplasm of breast: Secondary | ICD-10-CM

## 2023-06-02 ENCOUNTER — Encounter: Payer: Self-pay | Admitting: Plastic Surgery

## 2023-09-16 ENCOUNTER — Other Ambulatory Visit: Payer: Self-pay | Admitting: Obstetrics and Gynecology

## 2023-09-16 DIAGNOSIS — Z1231 Encounter for screening mammogram for malignant neoplasm of breast: Secondary | ICD-10-CM

## 2023-12-21 ENCOUNTER — Ambulatory Visit: Admitting: Dermatology

## 2023-12-28 ENCOUNTER — Ambulatory Visit
Admission: RE | Admit: 2023-12-28 | Discharge: 2023-12-28 | Disposition: A | Source: Ambulatory Visit | Attending: Obstetrics and Gynecology | Admitting: Obstetrics and Gynecology

## 2023-12-28 DIAGNOSIS — Z1231 Encounter for screening mammogram for malignant neoplasm of breast: Secondary | ICD-10-CM
